# Patient Record
Sex: Male | Born: 1940 | Race: White | Hispanic: No | Marital: Married | Smoking: Former smoker
Health system: Southern US, Community
[De-identification: ages and names within clinical notes are randomized; demographics above are authoritative.]

## PROBLEM LIST (undated history)

## (undated) DIAGNOSIS — E7800 Pure hypercholesterolemia, unspecified: Secondary | ICD-10-CM

## (undated) DIAGNOSIS — I4891 Unspecified atrial fibrillation: Secondary | ICD-10-CM

## (undated) DIAGNOSIS — I10 Essential (primary) hypertension: Secondary | ICD-10-CM

## (undated) DIAGNOSIS — E119 Type 2 diabetes mellitus without complications: Secondary | ICD-10-CM

## (undated) HISTORY — PX: SPLENECTOMY, TOTAL: SHX788

## (undated) HISTORY — PX: APPENDECTOMY: SHX54

## (undated) HISTORY — PX: ABDOMINAL SURGERY: SHX537

---

## 2001-06-24 ENCOUNTER — Encounter
Admission: RE | Admit: 2001-06-24 | Discharge: 2001-06-24 | Payer: Self-pay | Admitting: Physical Medicine and Rehabilitation

## 2001-06-24 ENCOUNTER — Encounter: Payer: Self-pay | Admitting: Physical Medicine and Rehabilitation

## 2007-01-06 ENCOUNTER — Ambulatory Visit: Payer: Self-pay | Admitting: Cardiology

## 2007-11-07 ENCOUNTER — Ambulatory Visit: Payer: Self-pay | Admitting: Cardiology

## 2017-08-11 ENCOUNTER — Emergency Department (HOSPITAL_COMMUNITY): Payer: Self-pay

## 2017-08-11 ENCOUNTER — Emergency Department (HOSPITAL_COMMUNITY)
Admission: EM | Admit: 2017-08-11 | Discharge: 2017-08-11 | Disposition: A | Discharge status: Home / Self Care | Payer: Medicare Other | Attending: Emergency Medicine | Admitting: Emergency Medicine

## 2017-08-11 ENCOUNTER — Encounter (HOSPITAL_COMMUNITY): Payer: Self-pay

## 2017-08-11 ENCOUNTER — Other Ambulatory Visit: Payer: Self-pay

## 2017-08-11 DIAGNOSIS — Z7901 Long term (current) use of anticoagulants: Secondary | ICD-10-CM | POA: Insufficient documentation

## 2017-08-11 DIAGNOSIS — Z7984 Long term (current) use of oral hypoglycemic drugs: Secondary | ICD-10-CM | POA: Insufficient documentation

## 2017-08-11 DIAGNOSIS — I10 Essential (primary) hypertension: Secondary | ICD-10-CM | POA: Insufficient documentation

## 2017-08-11 DIAGNOSIS — Z87891 Personal history of nicotine dependence: Secondary | ICD-10-CM | POA: Insufficient documentation

## 2017-08-11 DIAGNOSIS — E119 Type 2 diabetes mellitus without complications: Secondary | ICD-10-CM | POA: Insufficient documentation

## 2017-08-11 DIAGNOSIS — R0789 Other chest pain: Secondary | ICD-10-CM | POA: Insufficient documentation

## 2017-08-11 DIAGNOSIS — Z79899 Other long term (current) drug therapy: Secondary | ICD-10-CM | POA: Insufficient documentation

## 2017-08-11 HISTORY — DX: Pure hypercholesterolemia, unspecified: E78.00

## 2017-08-11 HISTORY — DX: Unspecified atrial fibrillation: I48.91

## 2017-08-11 HISTORY — DX: Essential (primary) hypertension: I10

## 2017-08-11 HISTORY — DX: Type 2 diabetes mellitus without complications: E11.9

## 2017-08-11 LAB — CBC
HCT: 42.4 % (ref 39.0–52.0)
Hemoglobin: 13.6 g/dL (ref 13.0–17.0)
MCH: 31.3 pg (ref 26.0–34.0)
MCHC: 32.1 g/dL (ref 30.0–36.0)
MCV: 97.7 fL (ref 78.0–100.0)
Platelets: 307 10*3/uL (ref 150–400)
RBC: 4.34 MIL/uL (ref 4.22–5.81)
RDW: 13.5 % (ref 11.5–15.5)
WBC: 9.4 10*3/uL (ref 4.0–10.5)

## 2017-08-11 LAB — TROPONIN I

## 2017-08-11 LAB — BASIC METABOLIC PANEL
Anion gap: 8 (ref 5–15)
BUN: 15 mg/dL (ref 6–20)
CALCIUM: 9.2 mg/dL (ref 8.9–10.3)
CO2: 27 mmol/L (ref 22–32)
Chloride: 101 mmol/L (ref 101–111)
Creatinine, Ser: 1.11 mg/dL (ref 0.61–1.24)
GFR calc Af Amer: >60 mL/min (ref >60)
Glucose, Bld: 213 mg/dL — ABNORMAL HIGH (ref 65–99)
Potassium: 4.3 mmol/L (ref 3.5–5.1)
SODIUM: 136 mmol/L (ref 135–145)

## 2017-08-11 MED ORDER — OXYCODONE-ACETAMINOPHEN 5-325 MG PO TABS: 1.0000 | ORAL_TABLET | ORAL | 0 refills | Status: DC | PRN

## 2017-08-11 NOTE — ED Triage Notes (Signed)
Patient reports of sudden onset of pain in right upper chest that started while sitting on cough. STates he used heating pad and pain went away. States pain is increased when taking deep breath in. Denies shortness of breath or cough.

## 2017-08-11 NOTE — Discharge Instructions (Signed)
Tests showed no life-threatening condition.  I believe your pain is in the wall of your chest, not your heart.  Prescription for pain medicine.  Follow-up your primary care doctor.

## 2017-08-12 NOTE — ED Provider Notes (Signed)
[facility] EMERGENCY DEPARTMENT Provider Note   CSN: [id number] Arrival date & time: 08/11/17  [id number]     History   Chief Complaint Chief Complaint  Patient presents with  . Chest Pain    HPI Lysle RubensRobert L Rowzee is a 77 y.o. male.  Right-sided chest pain since 2 PM on Friday worse with a deep breath.  No crushing substernal chest pain, diaphoresis, dyspnea.  No prolonged travel.  Past medical history includes diabetes, hypertension, hypercholesterolemia, atrial fibrillation.  He quit smoking 10 years ago.  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  Severity of symptoms is mild.      Past Medical History:  Diagnosis Date  . A-fib (HCC)   . Diabetes mellitus without complication (HCC)   . High cholesterol   . Hypertension     There are no active problems to display for this patient.   Past Surgical History:  Procedure Laterality Date  . ABDOMINAL SURGERY    . APPENDECTOMY    . SPLENECTOMY, TOTAL         Home Medications    Prior to Admission medications   Medication Sig Start Date End Date Taking? Authorizing Provider  atenolol (TENORMIN) 25 MG tablet Take 25 mg by mouth daily.   Yes [provider]  Calcium Carb-Cholecalciferol (CALCIUM 500 + D3) 500-600 MG-UNIT TABS Take 1 tablet by mouth daily.   Yes [provider]  cholecalciferol (VITAMIN D) 1000 units tablet Take 1,000 Units by mouth daily.   Yes [provider]  lipase/protease/amylase (CREON) 12000 units CPEP capsule Take 12,000 Units by mouth 2 (two) times daily.   Yes [provider]  magnesium oxide (MAG-OX) 400 MG tablet Take 400 mg by mouth daily.   Yes [provider]  metFORMIN (GLUCOPHAGE) 500 MG tablet Take 500 mg by mouth daily.   Yes [provider]  pantoprazole (PROTONIX) 40 MG tablet Take 40 mg by mouth 2 (two) times daily.   Yes [provider]  simvastatin (ZOCOR) 40 MG tablet Take  20 mg by mouth daily at 6 PM.   Yes [provider]  vitamin B-12 (CYANOCOBALAMIN) 1000 MCG tablet Take 1,000 mcg by mouth daily.   Yes [provider]  warfarin (COUMADIN) 5 MG tablet Take 5 mg by mouth daily at 6 PM.   Yes [provider]  oxyCODONE-acetaminophen (PERCOCET) 5-325 MG tablet Take 1-2 tablets by mouth every 4 (four) hours as needed. 08/11/17   Donnetta Hutchingook, Dayyan Krist, MD    Family History No family history on file.  Social History Social History   Tobacco Use  . Smoking status: Former Smoker    Types: Cigarettes  . Smokeless tobacco: Never Used  Substance Use Topics  . Alcohol use: No    Frequency: Never  . Drug use: No     Allergies   Patient has no known allergies.   Review of Systems Review of Systems  All other systems reviewed and are negative.    Physical Exam Updated Vital Signs BP (!) 133/96   Pulse 99   Temp 98.1 F (36.7 C) (Oral)   Resp 16   Ht 6' (1.829 m)   Wt 63.5 kg (140 lb)   SpO2 95%   BMI 18.99 kg/m   Physical Exam  Constitutional: He is oriented to person, place, and time. He appears well-developed and well-nourished.  HENT:  Head: Normocephalic and atraumatic.  Eyes: Conjunctivae are normal.  Neck:  Neck supple.  Cardiovascular: Normal rate and regular rhythm.  Pulmonary/Chest: Effort normal and breath sounds normal.  Abdominal: Soft. Bowel sounds are normal.  Musculoskeletal: Normal range of motion.  Neurological: He is alert and oriented to person, place, and time.  Skin: Skin is warm and dry.  Psychiatric: He has a normal mood and affect. His behavior is normal.  Nursing note and vitals reviewed.    ED Treatments / Results  Labs (all labs ordered are listed, but only abnormal results are displayed) Labs Reviewed  BASIC METABOLIC PANEL - Abnormal; Notable for the following components:      Result Value   Glucose, Bld 213 (*)    All other components within normal limits  CBC  TROPONIN I     EKG  EKG Interpretation None      Date: 08/12/2017  Rate: 89  Rhythm: atrial fib  QRS Axis: normal  Intervals: normal  ST/T Wave abnormalities: normal  Conduction Disutrbances: none  Narrative Interpretation: unremarkable     Radiology Dg Chest 2 View  Result Date: 08/11/2017 CLINICAL DATA:  Chest pain. EXAM: CHEST  2 VIEW COMPARISON:  Chest radiograph 06/12/2011. FINDINGS: Stable cardiomegaly with tortuosity of the thoracic aorta. No large area of pulmonary consolidation. Biapical pleuroparenchymal thickening. Scarring within the left mid lung. Thoracic spine degenerative changes. IMPRESSION: No acute cardiopulmonary process. Electronically Signed   By: Annia Belt M.D.   On: 08/11/2017 10:48    Procedures Procedures (including critical care time)  Medications Ordered in ED Medications - No data to display   Initial Impression / Assessment and Plan / ED Course  I have reviewed the triage vital signs and the nursing notes.  Pertinent labs & imaging results that were available during my care of the patient were reviewed by me and considered in my medical decision making (see chart for details).     Patient presents with right-sided chest pain worse with deep breath and palpation.  Screening labs, EKG, chest x-ray, troponin all negative.  Discharge medication Percocet.  Patient will follow up with his primary care doctor.  Final Clinical Impressions(s) / ED Diagnoses   Final diagnoses:  Chest wall pain    ED Discharge Orders        Ordered    oxyCODONE-acetaminophen (PERCOCET) 5-325 MG tablet  Every 4 hours PRN     08/11/17 1458       Donnetta Hutching, MD 08/12/17 2230

## 2018-08-04 ENCOUNTER — Emergency Department (HOSPITAL_COMMUNITY): Payer: Non-veteran care

## 2018-08-04 ENCOUNTER — Emergency Department (HOSPITAL_COMMUNITY)
Admission: EM | Admit: 2018-08-04 | Discharge: 2018-08-04 | Disposition: A | Discharge status: Home / Self Care | Payer: Non-veteran care | Attending: Emergency Medicine | Admitting: Emergency Medicine

## 2018-08-04 ENCOUNTER — Encounter (HOSPITAL_COMMUNITY): Payer: Self-pay

## 2018-08-04 DIAGNOSIS — Z7984 Long term (current) use of oral hypoglycemic drugs: Secondary | ICD-10-CM | POA: Insufficient documentation

## 2018-08-04 DIAGNOSIS — K861 Other chronic pancreatitis: Secondary | ICD-10-CM

## 2018-08-04 DIAGNOSIS — R740 Nonspecific elevation of levels of transaminase and lactic acid dehydrogenase [LDH]: Secondary | ICD-10-CM | POA: Diagnosis not present

## 2018-08-04 DIAGNOSIS — Z87891 Personal history of nicotine dependence: Secondary | ICD-10-CM | POA: Diagnosis not present

## 2018-08-04 DIAGNOSIS — Z7901 Long term (current) use of anticoagulants: Secondary | ICD-10-CM | POA: Insufficient documentation

## 2018-08-04 DIAGNOSIS — Z79899 Other long term (current) drug therapy: Secondary | ICD-10-CM | POA: Insufficient documentation

## 2018-08-04 DIAGNOSIS — R1013 Epigastric pain: Secondary | ICD-10-CM | POA: Diagnosis not present

## 2018-08-04 DIAGNOSIS — E119 Type 2 diabetes mellitus without complications: Secondary | ICD-10-CM | POA: Insufficient documentation

## 2018-08-04 DIAGNOSIS — R109 Unspecified abdominal pain: Secondary | ICD-10-CM

## 2018-08-04 DIAGNOSIS — I10 Essential (primary) hypertension: Secondary | ICD-10-CM | POA: Diagnosis not present

## 2018-08-04 DIAGNOSIS — R1110 Vomiting, unspecified: Secondary | ICD-10-CM | POA: Diagnosis present

## 2018-08-04 DIAGNOSIS — R7401 Elevation of levels of liver transaminase levels: Secondary | ICD-10-CM

## 2018-08-04 LAB — CBC WITH DIFFERENTIAL/PLATELET
ABS IMMATURE GRANULOCYTES: 0.04 10*3/uL (ref 0.00–0.07)
BASOS PCT: 0 %
Basophils Absolute: 0 10*3/uL (ref 0.0–0.1)
Eosinophils Absolute: 0 10*3/uL (ref 0.0–0.5)
Eosinophils Relative: 0 %
HEMATOCRIT: 41.3 % (ref 39.0–52.0)
HEMOGLOBIN: 13.3 g/dL (ref 13.0–17.0)
IMMATURE GRANULOCYTES: 1 %
LYMPHS ABS: 2.4 10*3/uL (ref 0.7–4.0)
LYMPHS PCT: 30 %
MCH: 31.3 pg (ref 26.0–34.0)
MCHC: 32.2 g/dL (ref 30.0–36.0)
MCV: 97.2 fL (ref 80.0–100.0)
MONO ABS: 0.7 10*3/uL (ref 0.1–1.0)
MONOS PCT: 9 %
NEUTROS ABS: 4.8 10*3/uL (ref 1.7–7.7)
NEUTROS PCT: 60 %
PLATELETS: 283 10*3/uL (ref 150–400)
RBC: 4.25 MIL/uL (ref 4.22–5.81)
RDW: 14.1 % (ref 11.5–15.5)
WBC: 8 10*3/uL (ref 4.0–10.5)
nRBC: 0 % (ref 0.0–0.2)

## 2018-08-04 LAB — COMPREHENSIVE METABOLIC PANEL
ALK PHOS: 210 U/L — AB (ref 38–126)
ALT: 109 U/L — AB (ref 0–44)
AST: 136 U/L — AB (ref 15–41)
Albumin: 3.5 g/dL (ref 3.5–5.0)
Anion gap: 10 (ref 5–15)
BUN: 13 mg/dL (ref 8–23)
CALCIUM: 9.5 mg/dL (ref 8.9–10.3)
CHLORIDE: 102 mmol/L (ref 98–111)
CO2: 28 mmol/L (ref 22–32)
CREATININE: 1.07 mg/dL (ref 0.61–1.24)
GFR calc non Af Amer: >60 mL/min (ref >60)
Glucose, Bld: 113 mg/dL — ABNORMAL HIGH (ref 70–99)
Potassium: 4.3 mmol/L (ref 3.5–5.1)
Sodium: 140 mmol/L (ref 135–145)
Total Bilirubin: 0.8 mg/dL (ref 0.3–1.2)
Total Protein: 6.5 g/dL (ref 6.5–8.1)

## 2018-08-04 LAB — PROTIME-INR
INR: 1.47
Prothrombin Time: 17.6 seconds — ABNORMAL HIGH (ref 11.4–15.2)

## 2018-08-04 LAB — I-STAT TROPONIN, ED: Troponin i, poc: 0 ng/mL (ref 0.00–0.08)

## 2018-08-04 LAB — CBG MONITORING, ED: Glucose-Capillary: 96 mg/dL (ref 70–99)

## 2018-08-04 LAB — LIPASE, BLOOD: LIPASE: 18 U/L (ref 11–51)

## 2018-08-04 MED ORDER — SUCRALFATE 1 G PO TABS: 1.0000 g | ORAL_TABLET | Freq: Three times a day (TID) | ORAL | 0 refills | Status: DC

## 2018-08-04 MED ORDER — PANTOPRAZOLE SODIUM 40 MG IV SOLR
40.0000 mg | Freq: Once | INTRAVENOUS | Status: AC
  Administered 2018-08-04: 40 mg via INTRAVENOUS
  Filled 2018-08-04: qty 40

## 2018-08-04 MED ORDER — MORPHINE SULFATE (PF) 4 MG/ML IV SOLN
4.0000 mg | Freq: Once | INTRAVENOUS | Status: AC
  Administered 2018-08-04: 4 mg via INTRAVENOUS
  Filled 2018-08-04: qty 1

## 2018-08-04 MED ORDER — IOHEXOL 300 MG/ML  SOLN
100.0000 mL | Freq: Once | INTRAMUSCULAR | Status: AC | PRN
  Administered 2018-08-04: 100 mL via INTRAVENOUS

## 2018-08-04 MED ORDER — LIDOCAINE VISCOUS HCL 2 % MT SOLN: 15.0000 mL | Freq: Once | OROMUCOSAL | Status: DC

## 2018-08-04 MED ORDER — ONDANSETRON HCL 4 MG/2ML IJ SOLN
4.0000 mg | Freq: Once | INTRAMUSCULAR | Status: AC
  Administered 2018-08-04: 4 mg via INTRAVENOUS
  Filled 2018-08-04: qty 2

## 2018-08-04 MED ORDER — ONDANSETRON 4 MG PO TBDP: 4.0000 mg | ORAL_TABLET | Freq: Three times a day (TID) | ORAL | 0 refills | Status: DC | PRN

## 2018-08-04 MED ORDER — SODIUM CHLORIDE 0.9 % IV BOLUS
1000.0000 mL | Freq: Once | INTRAVENOUS | Status: AC
  Administered 2018-08-04: 1000 mL via INTRAVENOUS

## 2018-08-04 MED ORDER — ALUM & MAG HYDROXIDE-SIMETH 200-200-20 MG/5ML PO SUSP: 30.0000 mL | Freq: Once | ORAL | Status: DC

## 2018-08-04 NOTE — ED Triage Notes (Addendum)
Pt states that he is having a burning sensation in abd area to chest; pt states that he has had this since 12/25; pt recently stopped prilosec and was supposed to [facility] proton[facility]. Pt states that he attempted to eat bkfst this am but he vomited it back up.

## 2018-08-04 NOTE — ED Provider Notes (Signed)
[facility] EMERGENCY DEPARTMENT Provider Note   CSN: [id number] Arrival date & time: 08/04/18  1308     History   Chief Complaint Chief Complaint  Patient presents with  . Abdominal Pain    HPI Curtis Morris is a 77 y.o. male with a history of peptic ulcer disease, diabetes mellitus, pancreatitis, A. fib on Coumadin (INR 2.0-3.0), hyperlipidemia, and hypertension who presents to the emergency department with a chief complaint of vomiting.  The patient endorses non-bloody vomiting for the last 5 days. States he vomits every time he tries to eat. He reports associated "burning" abdominal pain that radiates from his abdomen up to his throat. Pain is constant, but worsening since onset.  Reports he has some associated shortness of breath when pain intensifies.  His wife reports that he is also had an approximate 20 pound weight loss over the last few months.  States that he has had episodes of purging after eating due to pain and difficulty swallowing.  He denies fever, chills, melena, hematochezia, hematemesis, palpitations, leg swelling, back pain, dysuria, urinary frequency or hesitancy, diarrhea, constipation, dizziness, or lightheadedness.  He reports a family history of colorectal cancer.  Last colonoscopy was 2 years ago.  He is followed by primary care at the [facility].  He has not established with gastroenterology, but after he was seen 3 days ago was given a referral, but was told that it may take several months for an initial consult.  He reports that he has not drink alcohol or smoke cigarettes in > 20 years.  Reports that he underwent a partial gastrectomy and vagotomy many years ago for peptic ulcer disease.  Sx history includes splenectomy and appendectomy.  The history is provided by the patient. No language interpreter was used.    Past Medical History:  Diagnosis Date  . A-fib ([place])   . Diabetes mellitus without complication ([place])   . High cholesterol    . Hypertension     There are no active problems to display for this patient.   Past Surgical History:  Procedure Laterality Date  . ABDOMINAL SURGERY    . APPENDECTOMY    . SPLENECTOMY, TOTAL          Home Medications    Prior to Admission medications   Medication Sig Start Date End Date Taking? Authorizing Provider  atenolol (TENORMIN) 25 MG tablet Take 25 mg by mouth daily.    [provider]  Calcium Carb-Cholecalciferol (CALCIUM 500 + D3) 500-600 MG-UNIT TABS Take 1 tablet by mouth daily.    [provider]  cholecalciferol (VITAMIN D) 1000 units tablet Take 1,000 Units by mouth daily.    [provider]  lipase/protease/amylase (CREON) 12000 units CPEP capsule Take 12,000 Units by mouth 2 (two) times daily.    [provider]  magnesium oxide (MAG-OX) 400 MG tablet Take 400 mg by mouth daily.    [provider]  metFORMIN (GLUCOPHAGE) 500 MG tablet Take 500 mg by mouth daily.    [provider]  oxyCODONE-acetaminophen (PERCOCET) 5-325 MG tablet Take 1-2 tablets by mouth every 4 (four) hours as needed. 08/11/17   Nat Christen, MD  pantoprazole (PROTONIX) 40 MG tablet Take 40 mg by mouth 2 (two) times daily.    [provider]  simvastatin (ZOCOR) 40 MG tablet Take 20 mg by mouth daily at 6 PM.    [provider]  vitamin B-12 (CYANOCOBALAMIN) 1000 MCG tablet Take 1,000 mcg by mouth daily.  [provider]  warfarin (COUMADIN) 5 MG tablet Take 5 mg by mouth daily at 6 PM.    [provider]    Family History History reviewed. No pertinent family history.  Social History Social History   Tobacco Use  . Smoking status: Former Smoker    Types: Cigarettes  . Smokeless tobacco: Never Used  Substance Use Topics  . Alcohol use: No    Frequency: Never  . Drug use: No     Allergies   Patient has no known allergies.   Review of Systems Review of Systems  Constitutional:  Positive for unexpected weight change. Negative for appetite change, chills and fever.  Respiratory: Negative for shortness of breath.   Cardiovascular: Positive for chest pain. Negative for palpitations and leg swelling.  Gastrointestinal: Positive for abdominal pain and vomiting. Negative for anal bleeding, blood in stool, constipation, diarrhea and rectal pain.  Genitourinary: Negative for dysuria, flank pain, hematuria, penile pain, scrotal swelling, testicular pain and urgency.  Musculoskeletal: Negative for back pain, neck pain and neck stiffness.  Skin: Negative for rash and wound.  Allergic/Immunologic: Negative for immunocompromised state.  Neurological: Negative for weakness, numbness and headaches.  Psychiatric/Behavioral: Negative for confusion.   Physical Exam Updated Vital Signs BP 124/76 (BP Location: Right Arm)   Pulse 98   Temp 97.6 F (36.4 C) (Oral)   Resp 20   Ht 6' (1.829 m)   Wt 56.7 kg   SpO2 96%   BMI 16.95 kg/m   Physical Exam Vitals signs and nursing note reviewed.  Constitutional:      Appearance: He is well-developed.     Comments: Thin appearing elderly male.   HENT:     Head: Normocephalic.  Eyes:     Conjunctiva/sclera: Conjunctivae normal.  Neck:     Musculoskeletal: Neck supple.  Cardiovascular:     Rate and Rhythm: Normal rate and regular rhythm.     Heart sounds: No murmur. No friction rub. No gallop.   Pulmonary:     Effort: Pulmonary effort is normal. No respiratory distress.     Breath sounds: No stridor. No wheezing, rhonchi or rales.  Abdominal:     General: There is no distension.     Palpations: Abdomen is soft.     Tenderness: There is abdominal tenderness. There is no rebound. Negative signs include Murphy's sign, Rovsing's sign, McBurney's sign and psoas sign.     Comments: Tender to palpation in the epigastric and right upper quadrant.  Questionable Murphy sign.  No abdominal distention.  Lower abdomen is unremarkable.  No CVA  tenderness bilaterally.  No rebound or guarding.  Skin:    General: Skin is warm and dry.     Capillary Refill: Capillary refill takes less than 2 seconds.  Neurological:     Mental Status: He is alert.  Psychiatric:        Behavior: Behavior normal.    ED Treatments / Results  Labs (all labs ordered are listed, but only abnormal results are displayed) Labs Reviewed  PROTIME-INR - Abnormal; Notable for the following components:      Result Value   Prothrombin Time 17.6 (*)    All other components within normal limits  COMPREHENSIVE METABOLIC PANEL - Abnormal; Notable for the following components:   Glucose, Bld 113 (*)    AST 136 (*)    ALT 109 (*)    Alkaline Phosphatase 210 (*)    All other components within normal limits  CBC WITH  DIFFERENTIAL/PLATELET  LIPASE, BLOOD  CBG MONITORING, ED  I-STAT TROPONIN, ED  POC OCCULT BLOOD, ED    EKG EKG Interpretation  Date/Time:  _54  year old male with a history of peptic ulcer disease, diabetes mellitus, pancreatitis, A. fib on Coumadin (INR 2.0-3.0), hyperlipidemia, and hypertension who presents to the emergency department with abdominal pain, vomiting, and unintended weight loss.  Reports a 20 pound unintended weight loss over the last few months.  States he has been unable to keep down any  food for the last 5 days.  He was seen by his primary care provider at the [place] 4 days ago where he had an ultrasound and a CT performed, but the studies are unavailable at this time.  He was referred to GI for follow-up, but was told he would not be able to get an appointment for several months.  On exam, the patient is tender to palpation in the epigastric and right upper quadrant.  Questionable Murphy sign.  Morphine and Protonix given for pain control.  Zofran for nausea.  He is hemodynamically stable at this time.  INR is subtherapeutic at 1.49.  He has no leukocytosis.  He has elevated transaminases and alk phos.  No previous labs available for comparison.  Right upper quadrant ultrasound with layering sludge within the gallbladder, but otherwise negative.  Abdominal x-ray with air-fluid levels on the erect image and mildly distended small bowel loops in the upper abdomen concerning for partial small bowel obstruction versus ileus.  CT abdomen pelvis is pending.  Patient care transferred to Dr. Rex Kras at the end of my shift.  I anticipate the patient will require admission for endoscopy given his recent weight loss  and concern for partial small bowel obstruction on x-ray. Patient presentation, ED course, and plan of care discussed with review of all pertinent labs and imaging. Please see his/her note for further details regarding further ED course and disposition.   Final Clinical Impressions(s) / ED Diagnoses   Final diagnoses:  Abdominal pain  Elevated transaminase level    ED Discharge Orders    None       Joanne Gavel, PA-C 08/04/18 1746    Maudie Flakes, MD 08/05/18 [phone]

## 2018-08-04 NOTE — ED Notes (Signed)
Pt to CT at this time.

## 2018-08-04 NOTE — ED Provider Notes (Signed)
I received this patient [facility]. Pt p/w epigastric pain and nausea/vomiting as well as weight loss over the past few months.  Lab work was notable for mildly elevated LFTs.  The patient denies any alcohol use or heavy Tylenol use.  He denies any history of hepatitis.  Right upper quadrant ultrasound shows gallbladder sludge but no evidence of cholelithiasis or cholecystitis.  Proceeded with CT of abdomen and pelvis given abnormal KUB showing possible partial small bowel obstruction versus ileus.  CT shows no acute findings to explain the patient's symptoms.  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  I noted that his medication list includes PPI and Creon.  When asking further information about his symptoms, it sounds like his problems with eating and abdominal pain have been going on for many years, only worse recently.  He has had no vomiting in the ED.  His lab work shows no evidence of dehydration and no hypoalbuminemia to suggest severe malnutrition.  I contacted GI, Dr. Loreta AveMann, to discuss close outpatient f/u in GI clinic for consideration of upper endoscopy. Given reassuring imaging and VS, I feel he is safe for discharge. Will add carafate to his medications and reviewed GERD diet. Also provided w/ zofran. Return precautions reviewed.   Curtis Morris, Ambrose Finlandachel Morgan, MD 08/04/18 [phone]

## 2018-08-06 ENCOUNTER — Telehealth: Payer: Self-pay | Admitting: *Deleted

## 2018-08-06 NOTE — Telephone Encounter (Signed)
Pharmacy called related to Rx: zofran ODT not available .Marland Kitchen.Marland Kitchen.EDCM clarified with EDP (Hedges) to change Rx to: zofran tablets.

## 2021-11-24 ENCOUNTER — Other Ambulatory Visit: Payer: Self-pay

## 2021-11-24 ENCOUNTER — Encounter (HOSPITAL_COMMUNITY): Payer: Self-pay | Admitting: *Deleted

## 2021-11-24 ENCOUNTER — Emergency Department (HOSPITAL_COMMUNITY): Payer: No Typology Code available for payment source

## 2021-11-24 ENCOUNTER — Inpatient Hospital Stay (HOSPITAL_COMMUNITY)
Admission: EM | Admit: 2021-11-24 | Discharge: 2021-11-27 | DRG: 605 | Disposition: A | Discharge status: Home Health | Payer: No Typology Code available for payment source | Attending: Internal Medicine | Admitting: Internal Medicine

## 2021-11-24 DIAGNOSIS — Z7901 Long term (current) use of anticoagulants: Secondary | ICD-10-CM

## 2021-11-24 DIAGNOSIS — E119 Type 2 diabetes mellitus without complications: Secondary | ICD-10-CM

## 2021-11-24 DIAGNOSIS — I4891 Unspecified atrial fibrillation: Secondary | ICD-10-CM | POA: Diagnosis not present

## 2021-11-24 DIAGNOSIS — W19XXXA Unspecified fall, initial encounter: Principal | ICD-10-CM | POA: Diagnosis present

## 2021-11-24 DIAGNOSIS — Z87891 Personal history of nicotine dependence: Secondary | ICD-10-CM

## 2021-11-24 DIAGNOSIS — R2681 Unsteadiness on feet: Secondary | ICD-10-CM | POA: Diagnosis not present

## 2021-11-24 DIAGNOSIS — Y92009 Unspecified place in unspecified non-institutional (private) residence as the place of occurrence of the external cause: Secondary | ICD-10-CM

## 2021-11-24 DIAGNOSIS — E7800 Pure hypercholesterolemia, unspecified: Secondary | ICD-10-CM | POA: Diagnosis present

## 2021-11-24 DIAGNOSIS — I10 Essential (primary) hypertension: Secondary | ICD-10-CM | POA: Diagnosis not present

## 2021-11-24 DIAGNOSIS — S40022A Contusion of left upper arm, initial encounter: Principal | ICD-10-CM | POA: Diagnosis present

## 2021-11-24 DIAGNOSIS — Z79899 Other long term (current) drug therapy: Secondary | ICD-10-CM

## 2021-11-24 DIAGNOSIS — Z7984 Long term (current) use of oral hypoglycemic drugs: Secondary | ICD-10-CM

## 2021-11-24 DIAGNOSIS — E11649 Type 2 diabetes mellitus with hypoglycemia without coma: Secondary | ICD-10-CM | POA: Diagnosis present

## 2021-11-24 DIAGNOSIS — H5461 Unqualified visual loss, right eye, normal vision left eye: Secondary | ICD-10-CM | POA: Diagnosis present

## 2021-11-24 DIAGNOSIS — W11XXXA Fall on and from ladder, initial encounter: Secondary | ICD-10-CM | POA: Diagnosis present

## 2021-11-24 DIAGNOSIS — M7989 Other specified soft tissue disorders: Secondary | ICD-10-CM | POA: Diagnosis not present

## 2021-11-24 DIAGNOSIS — Z7982 Long term (current) use of aspirin: Secondary | ICD-10-CM

## 2021-11-24 DIAGNOSIS — Z8249 Family history of ischemic heart disease and other diseases of the circulatory system: Secondary | ICD-10-CM

## 2021-11-24 DIAGNOSIS — I951 Orthostatic hypotension: Secondary | ICD-10-CM | POA: Diagnosis not present

## 2021-11-24 LAB — BASIC METABOLIC PANEL
Anion gap: 5 (ref 5–15)
BUN: 13 mg/dL (ref 8–23)
CO2: 28 mmol/L (ref 22–32)
Calcium: 9 mg/dL (ref 8.9–10.3)
Chloride: 104 mmol/L (ref 98–111)
Creatinine, Ser: 0.93 mg/dL (ref 0.61–1.24)
GFR, Estimated: >60 mL/min (ref >60)
Glucose, Bld: 115 mg/dL — ABNORMAL HIGH (ref 70–99)
Potassium: 3.8 mmol/L (ref 3.5–5.1)
Sodium: 137 mmol/L (ref 135–145)

## 2021-11-24 LAB — CBC WITH DIFFERENTIAL/PLATELET
Abs Immature Granulocytes: 0.02 10*3/uL (ref 0.00–0.07)
Basophils Absolute: 0.1 10*3/uL (ref 0.0–0.1)
Basophils Relative: 1 %
Eosinophils Absolute: 0.2 10*3/uL (ref 0.0–0.5)
Eosinophils Relative: 2 %
HCT: 40.3 % (ref 39.0–52.0)
Hemoglobin: 13.3 g/dL (ref 13.0–17.0)
Immature Granulocytes: 0 %
Lymphocytes Relative: 34 %
Lymphs Abs: 2.7 10*3/uL (ref 0.7–4.0)
MCH: 32 pg (ref 26.0–34.0)
MCHC: 33 g/dL (ref 30.0–36.0)
MCV: 97.1 fL (ref 80.0–100.0)
Monocytes Absolute: 0.8 10*3/uL (ref 0.1–1.0)
Monocytes Relative: 10 %
Neutro Abs: 4.2 10*3/uL (ref 1.7–7.7)
Neutrophils Relative %: 53 %
Platelets: 302 10*3/uL (ref 150–400)
RBC: 4.15 MIL/uL — ABNORMAL LOW (ref 4.22–5.81)
RDW: 13.9 % (ref 11.5–15.5)
WBC: 7.9 10*3/uL (ref 4.0–10.5)
nRBC: 0 % (ref 0.0–0.2)

## 2021-11-24 LAB — HEMOGLOBIN AND HEMATOCRIT, BLOOD
HCT: 34.1 % — ABNORMAL LOW (ref 39.0–52.0)
Hemoglobin: 11.1 g/dL — ABNORMAL LOW (ref 13.0–17.0)

## 2021-11-24 MED ORDER — HYDROMORPHONE HCL 1 MG/ML IJ SOLN
0.5000 mg | INTRAMUSCULAR | Status: DC | PRN
  Administered 2021-11-24 – 2021-11-25 (×2): 0.5 mg via INTRAVENOUS
  Filled 2021-11-24 (×2): qty 0.5

## 2021-11-24 MED ORDER — ONDANSETRON HCL 4 MG/2ML IJ SOLN
4.0000 mg | Freq: Four times a day (QID) | INTRAMUSCULAR | Status: DC | PRN
Start: 2021-11-24 — End: 2021-11-27
  Administered 2021-11-27: 4 mg via INTRAVENOUS
  Filled 2021-11-24: qty 2

## 2021-11-24 MED ORDER — ACETAMINOPHEN 650 MG RE SUPP
650.0000 mg | Freq: Four times a day (QID) | RECTAL | Status: DC | PRN
Start: 2021-11-24 — End: 2021-11-27

## 2021-11-24 MED ORDER — POLYETHYLENE GLYCOL 3350 17 G PO PACK: 17.0000 g | PACK | Freq: Every day | ORAL | Status: DC | PRN

## 2021-11-24 MED ORDER — TAMSULOSIN HCL 0.4 MG PO CAPS
0.4000 mg | ORAL_CAPSULE | Freq: Every day | ORAL | Status: DC
  Administered 2021-11-24 – 2021-11-26 (×3): 0.4 mg via ORAL
  Filled 2021-11-24 (×3): qty 1

## 2021-11-24 MED ORDER — ACETAMINOPHEN 325 MG PO TABS: 650.0000 mg | ORAL_TABLET | Freq: Four times a day (QID) | ORAL | Status: DC | PRN

## 2021-11-24 MED ORDER — HYDROCODONE-ACETAMINOPHEN 5-325 MG PO TABS
1.0000 | ORAL_TABLET | Freq: Once | ORAL | Status: AC
  Administered 2021-11-24: 1 via ORAL
  Filled 2021-11-24: qty 1

## 2021-11-24 MED ORDER — FENTANYL CITRATE PF 50 MCG/ML IJ SOSY
25.0000 ug | PREFILLED_SYRINGE | Freq: Once | INTRAMUSCULAR | Status: AC
  Administered 2021-11-24: 25 ug via INTRAVENOUS
  Filled 2021-11-24: qty 1

## 2021-11-24 MED ORDER — ONDANSETRON HCL 4 MG PO TABS
4.0000 mg | ORAL_TABLET | Freq: Four times a day (QID) | ORAL | Status: DC | PRN
  Administered 2021-11-24: 4 mg via ORAL
  Filled 2021-11-24: qty 1

## 2021-11-24 MED ORDER — PANCRELIPASE (LIP-PROT-AMYL) 12000-38000 UNITS PO CPEP
12000.0000 [IU] | ORAL_CAPSULE | Freq: Two times a day (BID) | ORAL | Status: DC
  Administered 2021-11-25 – 2021-11-27 (×6): 12000 [IU] via ORAL
  Filled 2021-11-24 (×6): qty 1

## 2021-11-24 MED ORDER — PANTOPRAZOLE SODIUM 40 MG PO TBEC
40.0000 mg | DELAYED_RELEASE_TABLET | Freq: Two times a day (BID) | ORAL | Status: DC
  Administered 2021-11-24 – 2021-11-27 (×6): 40 mg via ORAL
  Filled 2021-11-24 (×6): qty 1

## 2021-11-24 MED ORDER — METOPROLOL TARTRATE 50 MG PO TABS
50.0000 mg | ORAL_TABLET | Freq: Two times a day (BID) | ORAL | Status: DC
  Filled 2021-11-24: qty 1

## 2021-11-24 NOTE — ED Notes (Signed)
Patient transported to CT 

## 2021-11-24 NOTE — ED Notes (Signed)
Ice pack applied to left arm.  °

## 2021-11-24 NOTE — ED Notes (Signed)
EDP made aware of pts worsening arm swelling and pain ?

## 2021-11-24 NOTE — Assessment & Plan Note (Signed)
Patient fell off a ladder while he was painting, he was also wearing his bedroom shoes.  He fell backwards hitting the left side of his head, his left arm and left wrist area.  ?-Head and cervical CT without acute abnormality. ?-Reports about 2-3 falls since January 2/2 initial orthostatic symptoms that occur when he is getting up from sitting or lying position.  ?-PT evaluation ?

## 2021-11-24 NOTE — H&P (Signed)
?History and Physical  ? ? ?Curtis Morris [id number] DOB: [birth year] DOA: 11/24/2021 ? ?PCP: [facility] [place]  ? ?Patient coming from: Home ? ?I have personally briefly reviewed patient's old medical records in [place] ? ?Chief Complaint: Fall ? ?HPI: Curtis Morris is a 81 y.o. male with medical history significant for atrial fibrillation on chronic anticoagulation with Xarelto, diabetes mellitus, hypertension. ?Presented to the ED with complaints of a fall.  Patient was standing on a ladder painting when he fell off down 3 steps.  He fell backwards hitting the left side of his head, his left arm, and left wrist area.  He sustained bruising to his arm. ?He reports 2-3 falls since January due to initial dizziness that occurs when he suddenly gets up from a sitting or lying position.  Dizziness usually resolves quickly, does not occur if he takes his time getting up.  He is blind in his right eye. ?He is on Eliquis and his last dose was this morning. ? ?ED Course: Stable vitals.  Head and cervical CT negative for acute abnormality.  X-ray of left humerus shows soft tissue swelling no acute fracture. ?Unremarkable CBC BMP. ?Hematoma involving left upper extremity significantly progressed while in the ED. ED provider talked to orthopedist on-call, he will see patient in the ED.  Hospitalist to admit due to concern for worsening hematoma and possible development of compartment syndrome. ? ?Review of Systems: As per HPI all other systems reviewed and negative. ? ?Past Medical History:  ?Diagnosis Date  ? A-fib ([place])   ? Diabetes mellitus without complication ([place])   ? High cholesterol   ? Hypertension   ? ? ?Past Surgical History:  ?Procedure Laterality Date  ? ABDOMINAL SURGERY    ? APPENDECTOMY    ? SPLENECTOMY, TOTAL    ? ? ? reports that he has quit smoking. His smoking use included cigarettes. He has never used smokeless tobacco. He reports that he does not drink alcohol and does not use  drugs. ? ?No Known Allergies ? ?Family history of hypertension. ? ?Prior to Admission medications   ?Medication Sig Start Date End Date Taking? Authorizing Provider  ?aspirin EC 81 MG tablet Take 81 mg by mouth daily. Swallow whole.   Yes [provider]  ?ATORVASTATIN CALCIUM PO Take 20 mg by mouth at bedtime.   Yes [provider]  ?Calcium Carbonate-Vitamin D (CALCIUM-VITAMIN D3 PO) Take 1 tablet by mouth 2 (two) times daily.   Yes [provider]  ?lipase/protease/amylase (CREON) 12000 units CPEP capsule Take 12,000 Units by mouth 2 (two) times daily.   Yes [provider]  ?magnesium oxide (MAG-OX) 400 MG tablet Take 400 mg by mouth daily.   Yes [provider]  ?metFORMIN (GLUCOPHAGE) 500 MG tablet Take 500 mg by mouth every evening.   Yes [provider]  ?metoprolol tartrate (LOPRESSOR) 100 MG tablet Take 50 mg by mouth 2 (two) times daily.   Yes [provider]  ?pantoprazole (PROTONIX) 40 MG tablet Take 40 mg by mouth 2 (two) times daily.   Yes [provider]  ?tamsulosin (FLOMAX) 0.4 MG CAPS capsule Take 0.4 mg by mouth at bedtime.   Yes [provider]  ?apixaban (ELIQUIS) 5 MG TABS tablet Take 5 mg by mouth 2 (two) times daily.    [provider]  ?cyclobenzaprine (FLEXERIL) 10 MG tablet Take 10 mg by mouth at bedtime.    [provider]  ?ondansetron (ZOFRAN ODT)  4 MG disintegrating tablet Take 1 tablet (4 mg total) by mouth every 8 (eight) hours as needed for nausea or vomiting. ?Patient not taking: Reported on 11/24/2021 08/04/18   Little, Wenda Overland, MD  ?oxyCODONE-acetaminophen (PERCOCET) 5-325 MG tablet Take 1-2 tablets by mouth every 4 (four) hours as needed. ?Patient not taking: Reported on 11/24/2021 08/11/17   Nat Christen, MD  ?sucralfate (CARAFATE) 1 g tablet Take 1 tablet (1 g total) by mouth 4 (four) times daily -  with meals and at bedtime for 7 days. ?Patient not taking: Reported on  11/24/2021 08/04/18 08/11/18  Little, Wenda Overland, MD  ? ? ?Physical Exam: ?Vitals:  ? 11/24/21 1900 11/24/21 1930 11/24/21 2000 11/24/21 2019  ?BP: (!) 161/80 (!) 157/95 (!) 114/98   ?Pulse: 87 82 90   ?Resp: 18 20 (!) 22   ?Temp:    97.8 ?F (36.6 ?C)  ?TempSrc:    Oral  ?SpO2: 99% 97% 97%   ?Weight:      ?Height:      ? ? ?Constitutional: NAD, calm, comfortable ?Vitals:  ? 11/24/21 1900 11/24/21 1930 11/24/21 2000 11/24/21 2019  ?BP: (!) 161/80 (!) 157/95 (!) 114/98   ?Pulse: 87 82 90   ?Resp: 18 20 (!) 22   ?Temp:    97.8 ?F (36.6 ?C)  ?TempSrc:    Oral  ?SpO2: 99% 97% 97%   ?Weight:      ?Height:      ? ?Eyes: pupils equal, lids and conjunctivae normal ?ENMT: Mucous membranes are moist.  ?Neck: normal, supple, no masses, no thyromegaly ?Respiratory: clear to auscultation bilaterally, no wheezing, no crackles. Normal respiratory effort. No accessory muscle use.  ?Cardiovascular: Regular rate and rhythm, no murmurs / rubs / gallops. No extremity edema.  Lower extremities warm.   ?Abdomen: no tenderness, no masses palpated. No hepatosplenomegaly. Bowel sounds positive.  ?Musculoskeletal: no clubbing / cyanosis. No joint deformity upper and lower extremities.  ?Skin: Large hematoma to left mid arm, extending downwards toward mid forearm, very superficial bruising to left arm, and left wrist.  Initially patient had a compressive dressing to his left forearm, on my evaluation, the left hand was was cold compared to his right, and had a dusky appearance.  He was able to squeeze my fingers, radial pulse palpable and present, reduced ulnar pulse. ?Talked to ED provider and patient was reevaluated, we had the compressive dressing removed.  On subsequent evaluation after ~108mins, left arm and hand are warm, very slight coolness to the tips of his fingers, significantly improved color. ?Neurologic: No apparent abnormality, moving extremities spontaneously.  ?Psychiatric: Normal judgment and insight. Alert and oriented x 3.  Normal mood.  ? ?Labs on Admission: I have personally reviewed following labs and imaging studies ? ?CBC: ?Recent Labs  ?Lab 11/24/21 ?1746  ?WBC 7.9  ?NEUTROABS 4.2  ?HGB 13.3  ?HCT 40.3  ?MCV 97.1  ?PLT 302  ? ?Basic Metabolic Panel: ?Recent Labs  ?Lab 11/24/21 ?1746  ?NA 137  ?K 3.8  ?CL 104  ?CO2 28  ?GLUCOSE 115*  ?BUN 13  ?CREATININE 0.93  ?CALCIUM 9.0  ? ? ?Radiological Exams on Admission: ?CT Head Wo Contrast ? ?Result Date: 11/24/2021 ?CLINICAL DATA:  Head injury after fall. EXAM: CT HEAD WITHOUT CONTRAST CT CERVICAL SPINE WITHOUT CONTRAST TECHNIQUE: Multidetector CT imaging of the head and cervical spine was performed following the standard protocol without intravenous contrast. Multiplanar CT image reconstructions of the cervical spine were also generated. RADIATION DOSE REDUCTION: This  exam was performed according to the departmental dose-optimization program which includes automated exposure control, adjustment of the mA and/or kV according to patient size and/or use of iterative reconstruction technique. COMPARISON:  None available currently. FINDINGS: CT HEAD FINDINGS Brain: Mild chronic ischemic white matter disease is noted. No mass effect or midline shift is noted. Ventricular size is within normal limits. There is no evidence of mass lesion, hemorrhage or acute infarction. Vascular: No hyperdense vessel or unexpected calcification. Skull: Normal. Negative for fracture or focal lesion. Sinuses/Orbits: Left frontal and ethmoid sinusitis is noted. Other: None. CT CERVICAL SPINE FINDINGS Alignment: Normal. Skull base and vertebrae: No acute fracture. No primary bone lesion or focal pathologic process. Soft tissues and spinal canal: No prevertebral fluid or swelling. No visible canal hematoma. Disc levels: Mild degenerative disc disease is noted at C4-5. Severe degenerative disc disease is noted at C6-7. Upper chest: Negative. Other: None. IMPRESSION: No acute intracranial abnormality seen. 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 No acute abnormality is noted. Electronically Signed   By: Marijo Conception M.D.   On: 11/24/2021 16:46  ? ?CT Cervical Spine Wo Contrast ? ?Result Date: 4/20

## 2021-11-24 NOTE — Assessment & Plan Note (Signed)
Stable ?-Resume metoprolol and tamsulosin in a.m. ?

## 2021-11-24 NOTE — ED Triage Notes (Signed)
Pt was painting and tripped and fell; pt has laceration to left upper arm, left wrist and hematoma to left head ?

## 2021-11-24 NOTE — ED Notes (Signed)
Pt has swelling to left upper arm, large skin tear and severe pain. Pt on blood thinner and reports he hit his head when he fell.  ?

## 2021-11-24 NOTE — Assessment & Plan Note (Signed)
-   HgbA1c ?- SSI- S ?-Hold home metformin ?

## 2021-11-24 NOTE — ED Provider Notes (Signed)
?[facility] EMERGENCY DEPARTMENT ?Provider Note ? ? ?CSN: [id number] ?Arrival date & time: 11/24/21  1440 ? ?  ? ?History ?Chief Complaint  ?Patient presents with  ? Fall  ? ? ?Curtis Morris is a 81 y.o. male with history of atrial fibrillation on Xarelto who presents to the emergency department today after a fall.  Patient states that he was painting and lost his balance and fell down 3 steps.  He struck his left arm and hit his head.  He did not lose consciousness.  He is complaining of swelling to the left upper extremity. ? ? ?Fall ? ? ?  ? ?Home Medications ?Prior to Admission medications   ?Medication Sig Start Date End Date Taking? Authorizing Provider  ?aspirin EC 81 MG tablet Take 81 mg by mouth daily. Swallow whole.   Yes [provider]  ?ATORVASTATIN CALCIUM PO Take 20 mg by mouth at bedtime.   Yes [provider]  ?lipase/protease/amylase (CREON) 12000 units CPEP capsule Take 12,000 Units by mouth 2 (two) times daily.   Yes [provider]  ?magnesium oxide (MAG-OX) 400 MG tablet Take 400 mg by mouth daily.   Yes [provider]  ?metFORMIN (GLUCOPHAGE) 500 MG tablet Take 500 mg by mouth every evening.   Yes [provider]  ?metoprolol tartrate (LOPRESSOR) 100 MG tablet Take 50 mg by mouth 2 (two) times daily.   Yes [provider]  ?pantoprazole (PROTONIX) 40 MG tablet Take 40 mg by mouth 2 (two) times daily.   Yes [provider]  ?tamsulosin (FLOMAX) 0.4 MG CAPS capsule Take 0.4 mg by mouth at bedtime.   Yes [provider]  ?apixaban (ELIQUIS) 5 MG TABS tablet Take 5 mg by mouth 2 (two) times daily.    [provider]  ?atenolol (TENORMIN) 25 MG tablet Take 25 mg by mouth daily.    [provider]  ?Calcium Carb-Cholecalciferol (CALCIUM 500 + D3) 500-600 MG-UNIT TABS Take 1 tablet by mouth daily.    [provider]  ?cholecalciferol (VITAMIN D) 1000 units tablet Take 1,000 Units by mouth daily.     [provider]  ?cyclobenzaprine (FLEXERIL) 10 MG tablet Take 10 mg by mouth at bedtime.    [provider]  ?ondansetron (ZOFRAN ODT) 4 MG disintegrating tablet Take 1 tablet (4 mg total) by mouth every 8 (eight) hours as needed for nausea or vomiting. 08/04/18   Little, Ambrose Finlandachel Morgan, MD  ?oxyCODONE-acetaminophen (PERCOCET) 5-325 MG tablet Take 1-2 tablets by mouth every 4 (four) hours as needed. 08/11/17   Donnetta Hutchingook, Brian, MD  ?simvastatin (ZOCOR) 40 MG tablet Take 20 mg by mouth daily at 6 PM.    [provider]  ?sucralfate (CARAFATE) 1 g tablet Take 1 tablet (1 g total) by mouth 4 (four) times daily -  with meals and at bedtime for 7 days. 08/04/18 08/11/18  Little, Ambrose Finlandachel Morgan, MD  ?vitamin B-12 (CYANOCOBALAMIN) 1000 MCG tablet Take 1,000 mcg by mouth daily.    [provider]  ?warfarin (COUMADIN) 5 MG tablet Take 5 mg by mouth daily at 6 PM.    [provider]  ?   ? ?Allergies    ?Patient has no known allergies.   ? ?Review of Systems   ?Review of Systems  ?All other systems reviewed and are negative. ? ?Physical Exam ?Updated Vital Signs ?BP (!) 114/98 (BP Location: Right Arm)   Pulse 90   Temp 97.9 ?F (36.6 ?C) (Oral)  Resp (!) 22   Ht 5\' 11"  (1.803 m)   Wt 63.5 kg   SpO2 97%   BMI 19.53 kg/m?  ?Physical Exam ?Vitals and nursing note reviewed.  ?Constitutional:   ?   General: He is not in acute distress. ?   Appearance: Normal appearance.  ?HENT:  ?   Head: Normocephalic and atraumatic.  ?Eyes:  ?   General:     ?   Right eye: No discharge.     ?   Left eye: No discharge.  ?Cardiovascular:  ?   Comments: Regular rate and rhythm.  S1/S2 are distinct without any evidence of murmur, rubs, or gallops.  Radial pulses are 2+ bilaterally.  Dorsalis pedis pulses are 2+ bilaterally.  No evidence of pedal edema. ?Pulmonary:  ?   Comments: Clear to auscultation bilaterally.  Normal effort.  No respiratory distress.  No evidence of wheezes, rales, or rhonchi heard  throughout. ?Abdominal:  ?   General: Abdomen is flat. Bowel sounds are normal. There is no distension.  ?   Tenderness: There is no abdominal tenderness. There is no guarding or rebound.  ?Musculoskeletal:     ?   General: Normal range of motion.  ?   Cervical back: Neck supple.  ?Skin: ?   General: Skin is warm and dry.  ?   Findings: No rash.  ?   Comments: There is skin tear to the left with moderate size underlying hematoma.  Hematoma is soft.  No evidence of compartment syndrome at this time. There is evidence of skin tear to the left anterior aspect of the wrist.  ?Neurological:  ?   General: No focal deficit present.  ?   Mental Status: He is alert.  ?Psychiatric:     ?   Mood and Affect: Mood normal.     ?   Behavior: Behavior normal.  ? ? ?ED Results / Procedures / Treatments   ?Labs ?(all labs ordered are listed, but only abnormal results are displayed) ?Labs Reviewed  ?CBC WITH DIFFERENTIAL/PLATELET - Abnormal; Notable for the following components:  ?    Result Value  ? RBC 4.15 (*)   ? All other components within normal limits  ?BASIC METABOLIC PANEL - Abnormal; Notable for the following components:  ? Glucose, Bld 115 (*)   ? All other components within normal limits  ? ? ?EKG ?None ? ?Radiology ?CT Head Wo Contrast ? ?Result Date: 11/24/2021 ?CLINICAL DATA:  Head injury after fall. EXAM: CT HEAD WITHOUT CONTRAST CT CERVICAL SPINE WITHOUT CONTRAST TECHNIQUE: Multidetector CT imaging of the head and cervical spine was performed following the standard protocol without intravenous contrast. Multiplanar CT image reconstructions of the cervical spine were also generated. RADIATION DOSE REDUCTION: This exam was performed according to the departmental dose-optimization program which includes automated exposure control, adjustment of the mA and/or kV according to patient size and/or use of iterative reconstruction technique. COMPARISON:  None available currently. FINDINGS: CT HEAD FINDINGS Brain: Mild chronic  ischemic white matter disease is noted. No mass effect or midline shift is noted. Ventricular size is within normal limits. There is no evidence of mass lesion, hemorrhage or acute infarction. Vascular: No hyperdense vessel or unexpected calcification. Skull: Normal. Negative for fracture or focal lesion. Sinuses/Orbits: Left frontal and ethmoid sinusitis is noted. Other: None. CT CERVICAL SPINE FINDINGS Alignment: Normal. Skull base and vertebrae: No acute fracture. No primary bone lesion or focal pathologic process. Soft tissues and spinal canal: No prevertebral fluid or  swelling. No visible canal hematoma. Disc levels: Mild degenerative disc disease is noted at C4-5. Severe degenerative disc disease is noted at C6-7. Upper chest: Negative. Other: None. IMPRESSION: No acute intracranial abnormality seen. 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 No acute abnormality is noted. Electronically Signed   By: Marijo Conception M.D.   On: 11/24/2021 16:46  ? ?CT Cervical Spine Wo Contrast ? ?Result Date: 11/24/2021 ?CLINICAL DATA:  Head injury after fall. EXAM: CT HEAD WITHOUT CONTRAST CT CERVICAL SPINE WITHOUT CONTRAST TECHNIQUE: Multidetector CT imaging of the head and cervical spine was performed following the standard protocol without intravenous contrast. Multiplanar CT image reconstructions of the cervical spine were also generated. RADIATION DOSE REDUCTION: This exam was performed according to the departmental dose-optimization program which includes automated exposure control, adjustment of the mA and/or kV according to patient size and/or use of iterative reconstruction technique. COMPARISON:  None available currently. FINDINGS: CT HEAD FINDINGS Brain: Mild chronic ischemic white matter disease is noted. No mass effect or midline shift is noted. Ventricular size is within normal limits. There is no evidence of mass lesion, hemorrhage or acute infarction. Vascular: No hyperdense vessel or  unexpected calcification. Skull: Normal. Negative for fracture or focal lesion. Sinuses/Orbits: Left frontal and ethmoid sinusitis is noted. Other: None. CT CERVICAL SPINE FINDINGS Alignment: Normal. Skull base and

## 2021-11-24 NOTE — Assessment & Plan Note (Addendum)
Rate controlled on anticoagulation with Xarelto ?-Hold Xarelto for now ?- Resume Metoprolol in a.m. ?

## 2021-11-24 NOTE — Assessment & Plan Note (Addendum)
Significant hematoma to left upper extremity extending to mid forearm. Initial reduced blood flow to left hand due to compressive dressing which has been removed with improvement in blood flow.  At this time no concern for compartment syndrome. ?- EDP talked to Dr. Dallas Schimke will see in the morning ?-Regular dressing to left upper extremity, hold off on compressive dressing for now ?-Check H&H, repeat CBC in the morning ?-IV Dilaudid 0.5 every 4 hours as needed ?-Hold aspirin and Plavix ?

## 2021-11-24 NOTE — ED Notes (Signed)
Provider at bedside

## 2021-11-24 NOTE — ED Notes (Signed)
EDP made aware of worsening swelling and bruising to left arm. Swelling now to forearm. Orders to apply ace bandage for pressure and continue with ice.  ?

## 2021-11-25 DIAGNOSIS — Z7984 Long term (current) use of oral hypoglycemic drugs: Secondary | ICD-10-CM | POA: Diagnosis not present

## 2021-11-25 DIAGNOSIS — Z79899 Other long term (current) drug therapy: Secondary | ICD-10-CM | POA: Diagnosis not present

## 2021-11-25 DIAGNOSIS — Z87891 Personal history of nicotine dependence: Secondary | ICD-10-CM | POA: Diagnosis not present

## 2021-11-25 DIAGNOSIS — Z7901 Long term (current) use of anticoagulants: Secondary | ICD-10-CM | POA: Diagnosis not present

## 2021-11-25 DIAGNOSIS — E119 Type 2 diabetes mellitus without complications: Secondary | ICD-10-CM | POA: Diagnosis not present

## 2021-11-25 DIAGNOSIS — Z8249 Family history of ischemic heart disease and other diseases of the circulatory system: Secondary | ICD-10-CM | POA: Diagnosis not present

## 2021-11-25 DIAGNOSIS — Z7982 Long term (current) use of aspirin: Secondary | ICD-10-CM | POA: Diagnosis not present

## 2021-11-25 DIAGNOSIS — Y92009 Unspecified place in unspecified non-institutional (private) residence as the place of occurrence of the external cause: Secondary | ICD-10-CM | POA: Diagnosis not present

## 2021-11-25 DIAGNOSIS — E11649 Type 2 diabetes mellitus with hypoglycemia without coma: Secondary | ICD-10-CM | POA: Diagnosis present

## 2021-11-25 DIAGNOSIS — I10 Essential (primary) hypertension: Secondary | ICD-10-CM | POA: Diagnosis present

## 2021-11-25 DIAGNOSIS — H5461 Unqualified visual loss, right eye, normal vision left eye: Secondary | ICD-10-CM | POA: Diagnosis present

## 2021-11-25 DIAGNOSIS — I4891 Unspecified atrial fibrillation: Secondary | ICD-10-CM | POA: Diagnosis present

## 2021-11-25 DIAGNOSIS — W11XXXA Fall on and from ladder, initial encounter: Secondary | ICD-10-CM | POA: Diagnosis present

## 2021-11-25 DIAGNOSIS — W19XXXA Unspecified fall, initial encounter: Secondary | ICD-10-CM | POA: Diagnosis not present

## 2021-11-25 DIAGNOSIS — I951 Orthostatic hypotension: Secondary | ICD-10-CM | POA: Diagnosis not present

## 2021-11-25 DIAGNOSIS — R2681 Unsteadiness on feet: Secondary | ICD-10-CM | POA: Diagnosis not present

## 2021-11-25 DIAGNOSIS — E7800 Pure hypercholesterolemia, unspecified: Secondary | ICD-10-CM | POA: Diagnosis present

## 2021-11-25 DIAGNOSIS — M7989 Other specified soft tissue disorders: Secondary | ICD-10-CM | POA: Diagnosis present

## 2021-11-25 DIAGNOSIS — S40022A Contusion of left upper arm, initial encounter: Secondary | ICD-10-CM | POA: Diagnosis present

## 2021-11-25 LAB — HEMOGLOBIN A1C
Hgb A1c MFr Bld: 7.8 % — ABNORMAL HIGH (ref 4.8–5.6)
Mean Plasma Glucose: 177.16 mg/dL

## 2021-11-25 LAB — CBC
HCT: 31.5 % — ABNORMAL LOW (ref 39.0–52.0)
Hemoglobin: 10.1 g/dL — ABNORMAL LOW (ref 13.0–17.0)
MCH: 31.2 pg (ref 26.0–34.0)
MCHC: 32.1 g/dL (ref 30.0–36.0)
MCV: 97.2 fL (ref 80.0–100.0)
Platelets: 227 10*3/uL (ref 150–400)
RBC: 3.24 MIL/uL — ABNORMAL LOW (ref 4.22–5.81)
RDW: 13.8 % (ref 11.5–15.5)
WBC: 8.4 10*3/uL (ref 4.0–10.5)
nRBC: 0 % (ref 0.0–0.2)

## 2021-11-25 MED ORDER — SODIUM CHLORIDE 0.9 % IV BOLUS
1000.0000 mL | Freq: Once | INTRAVENOUS | Status: AC
  Administered 2021-11-25: 1000 mL via INTRAVENOUS

## 2021-11-25 MED ORDER — HYDROCODONE-ACETAMINOPHEN 5-325 MG PO TABS
1.0000 | ORAL_TABLET | Freq: Four times a day (QID) | ORAL | Status: DC | PRN
Start: 2021-11-25 — End: 2021-11-27
  Administered 2021-11-25 – 2021-11-27 (×7): 1 via ORAL
  Filled 2021-11-25 (×7): qty 1

## 2021-11-25 NOTE — Plan of Care (Signed)
?  Problem: Acute Rehab PT Goals(only PT should resolve) ?Goal: Patient Will Transfer Sit To/From Stand ?Outcome: Progressing ?Flowsheets (Taken 11/25/2021 1011) ?Patient will transfer sit to/from stand: with modified independence ?Goal: Pt Will Transfer Bed To Chair/Chair To Bed ?Outcome: Progressing ?Flowsheets (Taken 11/25/2021 1011) ?Pt will Transfer Bed to Chair/Chair to Bed: with modified independence ?Goal: Pt Will Ambulate ?Outcome: Progressing ?Flowsheets (Taken 11/25/2021 1011) ?Pt will Ambulate: ? 25 feet ? with modified independence ? with least restrictive assistive device ?Goal: Pt/caregiver will Perform Home Exercise Program ?Outcome: Progressing ?Flowsheets (Taken 11/25/2021 1011) ?Pt/caregiver will Perform Home Exercise Program: ? For increased strengthening ? For improved balance ? Independently ? 10:11 AM, 11/25/21 ?Mearl Latin PT, DPT ?Physical Therapist at [facility] ?[facility] ? ?

## 2021-11-25 NOTE — TOC Initial Note (Signed)
Transition of Care (TOC) - Initial/Assessment Note  ? ? ?Patient Details  ?Name: Curtis Morris ?MRN: [id number] ?Date of Birth: [birth year] ? ?Transition of Care (TOC) CM/SW Contact:    ?Iona Beard, LCSWA ?Phone Number: ?11/25/2021, 11:28 AM ? ?Clinical Narrative:                 ?CSW updated that PT is recommending HH PT. CSW spoke with pts wife who was present with husband about this recommendation. She is agreeable to [facility] referral for pt. CSW reached out to [place] with [place] who will review. CSW requested that MD place [facility] orders. TOC to follow.  ? ?Expected Discharge Plan: [place] ?Barriers to Discharge: Continued Medical Work up ? ? ?Patient Goals and CMS Choice ?Patient states their goals for this hospitalization and ongoing recovery are:: Home with HH ?CMS Medicare.gov Compare Post Acute Care list provided to:: Patient ?Choice offered to / list presented to : Patient, Spouse ? ?Expected Discharge Plan and Services ?Expected Discharge Plan: [place] ?In-house Referral: Clinical Social Work ?  ?Post Acute Care Choice: Home Health ?Living arrangements for the past 2 months: [place] ?                ?  ?  ?  ?  ?  ?  ?  ?  ?  ?  ? ?Prior Living Arrangements/Services ?Living arrangements for the past 2 months: [place] ?Lives with:: Self, Spouse ?Patient language and need for interpreter reviewed:: Yes ?Do you feel safe going back to the place where you live?: Yes      ?Need for Family Participation in Patient Care: Yes (Comment) ?Care giver support system in place?: Yes (comment) ?  ?Criminal Activity/Legal Involvement Pertinent to Current Situation/Hospitalization: No - Comment as needed ? ?Activities of Daily Living ?Home Assistive Devices/Equipment: None ?ADL Screening (condition at time of admission) ?Patient's cognitive ability adequate to safely complete daily activities?: Yes ?Is the patient deaf or have difficulty hearing?: No ?Does the  patient have difficulty seeing, even when wearing glasses/contacts?: No ?Does the patient have difficulty concentrating, remembering, or making decisions?: No ?Patient able to express need for assistance with ADLs?: Yes ?Does the patient have difficulty dressing or bathing?: Yes ?Independently performs ADLs?: Yes (appropriate for developmental age) ?Does the patient have difficulty walking or climbing stairs?: No ?Weakness of Legs: None ?Weakness of Arms/Hands: Both ? ?Permission Sought/Granted ?  ?  ?   ?   ?   ?   ? ?Emotional Assessment ?Appearance:: Appears stated age ?Attitude/Demeanor/Rapport: Engaged ?Affect (typically observed): Accepting ?Orientation: : Oriented to Self, Oriented to Place, Oriented to  Time, Oriented to Situation ?Alcohol / Substance Use: Not Applicable ?Psych Involvement: No (comment) ? ?Admission diagnosis:  Fall [W19.XXXA] ?Fall, initial encounter [W19.XXXA] ?Contusion of left upper extremity, initial encounter [S40.022A] ?Patient Active Problem List  ? Diagnosis Date Noted  ? Fall 11/24/2021  ? Atrial fibrillation ([place]) 11/24/2021  ? Hematoma of arm, left, initial encounter 11/24/2021  ? Diabetes mellitus ([place]) 11/24/2021  ? Essential hypertension 11/24/2021  ? ?PCP:  [facility] [place]:   ?[id number] [place] [address] ?[place] [address] ?Phone: [phone] Fax: [phone] ? ? ? ? ?Social Determinants of Health (SDOH) Interventions ?  ? ?Readmission Risk Interventions ?   ? View : No data to display.  ?  ?  ?  ? ? ? ?

## 2021-11-25 NOTE — Consult Note (Signed)
? ?ORTHOPAEDIC CONSULTATION ? ?REQUESTING PHYSICIAN: Little Ishikawa, MD ? ?ASSESSMENT AND PLAN: ?81 y.o. male with the following: Left upper arm hematoma with diffuse swelling. ? ?Initial concern for evolving hematoma and potential for compartment syndrome.  Compartments are soft at this time.  He does have some skin tearing, and obvious oozing.  Recommend local wound care, and gentle compression to minimize the development of a further hematoma.  Nothing further is needed from an orthopedic perspective.  However, if his symptoms worsen or another symptom develops, I am available for reevaluation. ? ?Orthopedics recommends admission to a medical service and we will provide consultation and follow along ? ?- Weight Bearing Status/Activity: WBAT, ROM as tolerated ? ?- Additional recommended labs/tests: None ? ?-VTE Prophylaxis: Resume chronic anticoagulation, as soon as it is deemed safe ? ?- Pain control: As needed ? ?- Follow-up plan: To be determined ? ?-Procedures: None ? ?Chief Complaint: Left arm swelling ? ?HPI: ?Curtis Morris is a 81 y.o. male who presented after a fall, for evaluation of left arm pain.  He was doing some work at his home, when he stumbled and fell.  He states he lost his balance and fell down 3 steps.  He struck his arm and hit his head on the way down.  He has noted extreme swelling in the left upper extremity.  The pain was progressively worsening.  He presented to the ED for evaluation as a result.  No numbness or tingling.  He has been complaining of some nausea.  He is also been dizzy when he stands up. ? ?Past Medical History:  ?Diagnosis Date  ? A-fib ([place])   ? Diabetes mellitus without complication ([place])   ? High cholesterol   ? Hypertension   ? ?Past Surgical History:  ?Procedure Laterality Date  ? ABDOMINAL SURGERY    ? APPENDECTOMY    ? SPLENECTOMY, TOTAL    ? ?Social History  ? ?Socioeconomic History  ? Marital status: Married  ?  Spouse name: Not on file  ? Number of  children: Not on file  ? Years of education: Not on file  ? Highest education level: Not on file  ?Occupational History  ? Not on file  ?Tobacco Use  ? Smoking status: Former  ?  Types: Cigarettes  ? Smokeless tobacco: Never  ?Substance and Sexual Activity  ? Alcohol use: No  ? Drug use: No  ? Sexual activity: Not on file  ?Other Topics Concern  ? Not on file  ?Social History Narrative  ? Not on file  ? ?Social Determinants of Health  ? ?Financial Resource Strain: Not on file  ?Food Insecurity: Not on file  ?Transportation Needs: Not on file  ?Physical Activity: Not on file  ?Stress: Not on file  ?Social Connections: Not on file  ? ?History reviewed. No pertinent family history. ?No Known Allergies ?Prior to Admission medications   ?Medication Sig Start Date End Date Taking? Authorizing Provider  ?aspirin EC 81 MG tablet Take 81 mg by mouth daily. Swallow whole.   Yes [provider]  ?ATORVASTATIN CALCIUM PO Take 20 mg by mouth at bedtime.   Yes [provider]  ?Calcium Carbonate-Vitamin D (CALCIUM-VITAMIN D3 PO) Take 1 tablet by mouth 2 (two) times daily.   Yes [provider]  ?lipase/protease/amylase (CREON) 12000 units CPEP capsule Take 12,000 Units by mouth 2 (two) times daily.   Yes [provider]  ?magnesium oxide (MAG-OX) 400 MG tablet Take 400 mg by  mouth daily.   Yes [provider]  ?metFORMIN (GLUCOPHAGE) 500 MG tablet Take 500 mg by mouth every evening.   Yes [provider]  ?metoprolol tartrate (LOPRESSOR) 100 MG tablet Take 50 mg by mouth 2 (two) times daily.   Yes [provider]  ?pantoprazole (PROTONIX) 40 MG tablet Take 40 mg by mouth 2 (two) times daily.   Yes [provider]  ?tamsulosin (FLOMAX) 0.4 MG CAPS capsule Take 0.4 mg by mouth at bedtime.   Yes [provider]  ?apixaban (ELIQUIS) 5 MG TABS tablet Take 5 mg by mouth 2 (two) times daily.    [provider]  ?cyclobenzaprine (FLEXERIL) 10 MG  tablet Take 10 mg by mouth at bedtime.    [provider]  ?ondansetron (ZOFRAN ODT) 4 MG disintegrating tablet Take 1 tablet (4 mg total) by mouth every 8 (eight) hours as needed for nausea or vomiting. ?Patient not taking: Reported on 11/24/2021 08/04/18   Little, Wenda Overland, MD  ?oxyCODONE-acetaminophen (PERCOCET) 5-325 MG tablet Take 1-2 tablets by mouth every 4 (four) hours as needed. ?Patient not taking: Reported on 11/24/2021 08/11/17   Nat Christen, MD  ?sucralfate (CARAFATE) 1 g tablet Take 1 tablet (1 g total) by mouth 4 (four) times daily -  with meals and at bedtime for 7 days. ?Patient not taking: Reported on 11/24/2021 08/04/18 08/11/18  Little, Wenda Overland, MD  ? ?CT Head Wo Contrast ? ?Result Date: 11/24/2021 ?CLINICAL DATA:  Head injury after fall. EXAM: CT HEAD WITHOUT CONTRAST CT CERVICAL SPINE WITHOUT CONTRAST TECHNIQUE: Multidetector CT imaging of the head and cervical spine was performed following the standard protocol without intravenous contrast. Multiplanar CT image reconstructions of the cervical spine were also generated. RADIATION DOSE REDUCTION: This exam was performed according to the departmental dose-optimization program which includes automated exposure control, adjustment of the mA and/or kV according to patient size and/or use of iterative reconstruction technique. COMPARISON:  None available currently. FINDINGS: CT HEAD FINDINGS Brain: Mild chronic ischemic white matter disease is noted. No mass effect or midline shift is noted. Ventricular size is within normal limits. There is no evidence of mass lesion, hemorrhage or acute infarction. Vascular: No hyperdense vessel or unexpected calcification. Skull: Normal. Negative for fracture or focal lesion. Sinuses/Orbits: Left frontal and ethmoid sinusitis is noted. Other: None. CT CERVICAL SPINE FINDINGS Alignment: Normal. Skull base and vertebrae: No acute fracture. No primary bone lesion or focal pathologic process. Soft tissues  and spinal canal: No prevertebral fluid or swelling. No visible canal hematoma. Disc levels: Mild degenerative disc disease is noted at C4-5. Severe degenerative disc disease is noted at C6-7. Upper chest: Negative. Other: None. IMPRESSION: No acute intracranial abnormality seen. 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 No acute abnormality is noted. Electronically Signed   By: Marijo Conception M.D.   On: 11/24/2021 16:46  ? ?CT Cervical Spine Wo Contrast ? ?Result Date: 11/24/2021 ?CLINICAL DATA:  Head injury after fall. EXAM: CT HEAD WITHOUT CONTRAST CT CERVICAL SPINE WITHOUT CONTRAST TECHNIQUE: Multidetector CT imaging of the head and cervical spine was performed following the standard protocol without intravenous contrast. Multiplanar CT image reconstructions of the cervical spine were also generated. RADIATION DOSE REDUCTION: This exam was performed according to the departmental dose-optimization program which includes automated exposure control, adjustment of the mA and/or kV according to patient size and/or use of iterative reconstruction technique. COMPARISON:  None available currently. FINDINGS: CT HEAD FINDINGS Brain: Mild chronic ischemic  white matter disease is noted. No mass effect or midline shift is noted. Ventricular size is within normal limits. There is no evidence of mass lesion, hemorrhage or acute infarction. Vascular: No hyperdense vessel or unexpected calcification. Skull: Normal. Negative for fracture or focal lesion. Sinuses/Orbits: Left frontal and ethmoid sinusitis is noted. Other: None. CT CERVICAL SPINE FINDINGS Alignment: Normal. Skull base and vertebrae: No acute fracture. No primary bone lesion or focal pathologic process. Soft tissues and spinal canal: No prevertebral fluid or swelling. No visible canal hematoma. Disc levels: Mild degenerative disc disease is noted at C4-5. Severe degenerative disc disease is noted at C6-7. Upper chest: Negative. Other:  None. IMPRESSION: No acute intracranial abnormality seen. 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 No acute abnormality is noted. Electronically Signed   By: Marijo Conception M.D.   O

## 2021-11-25 NOTE — Progress Notes (Signed)
?PROGRESS NOTE ? ? ? ?Curtis Morris  XBD:[id number] DOB: 13-Apr-1941 DOA: 11/24/2021 ?PCP: [facility] ? ? ?Brief Narrative:  ?Curtis Morris is a 81 y.o. male with medical history significant for atrial fibrillation on chronic anticoagulation with Xarelto, non-insulin-dependent diabetes mellitus, hypertension. Patient presented from home after mechanical fall off a ladder with substantially large left upper extremity hematoma, initially concerning for compartment syndrome but evaluated and found to be vascularly intact distally to the hematoma. ? ? ?Assessment & Plan: ?  ?Principal Problem: ?  Fall ?Active Problems: ?  Atrial fibrillation (HCC) ?  Hematoma of arm, left, initial encounter ?  Diabetes mellitus (HCC) ?  Essential hypertension ? ? ?Large hematoma of arm, left, initial encounter, without compartment syndrome, POA ?Secondary to mechanical fall likely complicated by orthostatics, acute on recurrent ?-Distal blood flow intact, continue dressing changes per recommendations by wound care ?-Hold liquids ?-Repeat H&H downtrending as expected given anticoagulation ?-No current indication for intervention at this time ?-Continue supportive care, warm compresses, loose bandage, avoid overly tightened bandages or tape ?-Discontinue IV pain medication given below ?-PT OT to follow, patient markedly unstable on his feet today ?-Head and cervical CT without acute abnormality. ?  ?Profound orthostatic hypotension  ?-Patient evaluated today at bedside with PT with profound orthostatic hypotension and nausea vomiting and unsteady gait ?-IV fluid challenge ongoing ?-Symptoms currently well controlled at rest while seated ? ?Essential hypertension ?-Borderline hypotensive today, hold metoprolol ?  ?Diabetes mellitus (HCC), non-insulin-dependent, uncontrolled ?- HgbA1c elevated at 7.8 ?-Continue sliding scale insulin, hypoglycemic protocol, hold home metformin in the interim ?  ?Atrial fibrillation  (HCC) ?Rate controlled -hold Eliquis; restart date pending left upper extremity hematoma resolution, likely 1 to 2 weeks in estimation ?- Resume Metoprolol in a.m. ? ?DVT prophylaxis: None given above ?Code Status: Full ?Family Communication: At bedside ? ?Status is: Inpatient ? ?Dispo: The patient is from: Home ?             Anticipated d/c is to: To be determined ?             Anticipated d/c date is: 48 to 72 hours ?             Patient currently not medically stable for discharge ? ?Consultants:  ?None ? ?Procedures:  ?None ? ?Antimicrobials:  ?None ? ?Subjective: ?No acute issues or events overnight, on attempted to stand and walk today patient became profoundly orthostatic with episodes of nausea vomiting and ongoing hypotension.  Otherwise denies chest pain fevers chills diarrhea shortness of breath. ? ?Objective: ?Vitals:  ? 11/25/21 0604 11/25/21 0808 11/25/21 0941 11/25/21 1133  ?BP: 106/64 (!) 80/61 (!) 77/26 111/68  ?Pulse: 95 84 (!) 43 99  ?Resp: 18     ?Temp: 97.6 ?F (36.4 ?C)   (!) 97.4 ?F (36.3 ?C)  ?TempSrc: Oral Oral Oral Oral  ?SpO2: 96% 100% 98% 99%  ?Weight:      ?Height:      ? ? ?Intake/Output Summary (Last 24 hours) at 11/25/2021 1145 ?Last data filed at 11/25/2021 8341 ?Gross per 24 hour  ?Intake 240 ml  ?Output 300 ml  ?Net -60 ml  ? ?Filed Weights  ? 11/24/21 1518  ?Weight: 63.5 kg  ? ? ?Examination: ? ?General:  Pleasantly resting in bed, No acute distress. ?HEENT:  Normocephalic atraumatic.  Sclerae nonicteric, noninjected.  Extraocular movements intact bilaterally. ?Neck:  Without mass or deformity.  Trachea is midline. ?Lungs:  Clear to auscultate bilaterally without  rhonchi, wheeze, or rales. ?Heart:  Regular rate and rhythm.  Without murmurs, rubs, or gallops. ?Abdomen:  Soft, nontender, nondistended.  Without guarding or rebound. ?Extremities: Large hematoma left medial upper extremity proximal to the elbow with notable 5 cm skin laceration ?Skin: Large circumferential ecchymosis of  the left upper extremity extending distally to the left forearm, demarcation stable from yesterday's outline ? ? ?Data Reviewed: I have personally reviewed following labs and imaging studies ? ?CBC: ?Recent Labs  ?Lab 11/24/21 ?1746 11/24/21 ?2152 11/25/21 ?0446  ?WBC 7.9  --  8.4  ?NEUTROABS 4.2  --   --   ?HGB 13.3 11.1* 10.1*  ?HCT 40.3 34.1* 31.5*  ?MCV 97.1  --  97.2  ?PLT 302  --  227  ? ?Basic Metabolic Panel: ?Recent Labs  ?Lab 11/24/21 ?1746  ?NA 137  ?K 3.8  ?CL 104  ?CO2 28  ?GLUCOSE 115*  ?BUN 13  ?CREATININE 0.93  ?CALCIUM 9.0  ? ?GFR: ?Estimated Creatinine Clearance: 57.8 mL/min (by C-G formula based on SCr of 0.93 mg/dL). ?Liver Function Tests: ?No results for input(s): AST, ALT, ALKPHOS, BILITOT, PROT, ALBUMIN in the last 168 hours. ?No results for input(s): LIPASE, AMYLASE in the last 168 hours. ?No results for input(s): AMMONIA in the last 168 hours. ?Coagulation Profile: ?No results for input(s): INR, PROTIME in the last 168 hours. ?Cardiac Enzymes: ?No results for input(s): CKTOTAL, CKMB, CKMBINDEX, TROPONINI in the last 168 hours. ?BNP (last 3 results) ?No results for input(s): PROBNP in the last 8760 hours. ?HbA1C: ?Recent Labs  ?  11/24/21 ?2152  ?HGBA1C 7.8*  ? ?CBG: ?No results for input(s): GLUCAP in the last 168 hours. ?Lipid Profile: ?No results for input(s): CHOL, HDL, LDLCALC, TRIG, CHOLHDL, LDLDIRECT in the last 72 hours. ?Thyroid Function Tests: ?No results for input(s): TSH, T4TOTAL, FREET4, T3FREE, THYROIDAB in the last 72 hours. ?Anemia Panel: ?No results for input(s): VITAMINB12, FOLATE, FERRITIN, TIBC, IRON, RETICCTPCT in the last 72 hours. ?Sepsis Labs: ?No results for input(s): PROCALCITON, LATICACIDVEN in the last 168 hours. ? ?No results found for this or any previous visit (from the past 240 hour(s)).  ? ? ? ? ? ?Radiology Studies: ?CT Head Wo Contrast ? ?Result Date: 11/24/2021 ?CLINICAL DATA:  Head injury after fall. EXAM: CT HEAD WITHOUT CONTRAST CT CERVICAL SPINE WITHOUT  CONTRAST TECHNIQUE: Multidetector CT imaging of the head and cervical spine was performed following the standard protocol without intravenous contrast. Multiplanar CT image reconstructions of the cervical spine were also generated. RADIATION DOSE REDUCTION: This exam was performed according to the departmental dose-optimization program which includes automated exposure control, adjustment of the mA and/or kV according to patient size and/or use of iterative reconstruction technique. COMPARISON:  None available currently. FINDINGS: CT HEAD FINDINGS Brain: Mild chronic ischemic white matter disease is noted. No mass effect or midline shift is noted. Ventricular size is within normal limits. There is no evidence of mass lesion, hemorrhage or acute infarction. Vascular: No hyperdense vessel or unexpected calcification. Skull: Normal. Negative for fracture or focal lesion. Sinuses/Orbits: Left frontal and ethmoid sinusitis is noted. Other: None. CT CERVICAL SPINE FINDINGS Alignment: Normal. Skull base and vertebrae: No acute fracture. No primary bone lesion or focal pathologic process. Soft tissues and spinal canal: No prevertebral fluid or swelling. No visible canal hematoma. Disc levels: Mild degenerative disc disease is noted at C4-5. Severe degenerative disc disease is noted at C6-7. Upper chest: Negative. Other: None. IMPRESSION: No acute intracranial abnormality seen. Multilevel degenerative disc disease is  noted in the cervical spine. No acute abnormality is noted. Electronically Signed   By: Lupita RaiderJames  Green Jr M.D.   On: 11/24/2021 16:46  ? ?CT Cervical Spine Wo Contrast ? ?Result Date: 11/24/2021 ?CLINICAL DATA:  Head injury after fall. EXAM: CT HEAD WITHOUT CONTRAST CT CERVICAL SPINE WITHOUT CONTRAST TECHNIQUE: Multidetector CT imaging of the head and cervical spine was performed following the standard protocol without intravenous contrast. Multiplanar CT image reconstructions of the cervical spine were also  generated. RADIATION DOSE REDUCTION: This exam was performed according to the departmental dose-optimization program which includes automated exposure control, adjustment of the mA and/or kV according to patient size

## 2021-11-25 NOTE — Progress Notes (Signed)
?  Transition of Care (TOC) Screening Note ? ? ?Patient Details  ?Name: Curtis Morris ?Date of Birth: 1940-08-14 ? ? ?Transition of Care (TOC) CM/SW Contact:    ?Villa Herb, LCSWA ?Phone Number: ?11/25/2021, 8:25 AM ? ?[facility] notified of hospital admission. Notification ID is [id number]. ? ?Transition of Care Department [facility]) has reviewed patient and no TOC needs have been identified at this time. We will continue to monitor patient advancement through interdisciplinary progression rounds. If new patient transition needs arise, please place a TOC consult. ?  ?

## 2021-11-25 NOTE — Evaluation (Signed)
Physical Therapy Evaluation ?Patient Details ?Name: Curtis Morris ?MRN: [id number] ?DOB: Jul 23, 1941 ?Today's Date: 11/25/2021 ? ?History of Present Illness ? Curtis Morris is a 81 y.o. male with medical history significant for atrial fibrillation on chronic anticoagulation with Xarelto, diabetes mellitus, hypertension.  Presented to the ED with complaints of a fall.  Patient was standing on a ladder painting when he fell off down 3 steps.  He fell backwards hitting the left side of his head, his left arm, and left wrist area.  He sustained bruising to his arm.  He reports 2-3 falls since January due to initial dizziness that occurs when he suddenly gets up from a sitting or lying position.  Dizziness usually resolves quickly, does not occur if he takes his time getting up.  He is blind in his right eye.  He is on Eliquis and his last dose was this morning. ? ?  ?Clinical Impression ? Patient limited for functional mobility as stated below secondary to BLE and LUE weakness, pain, nausea and balance deficits. Patient does not require assist with bed mobility but immediately c/o dizziness and nausea with dry heaving. Patient demonstrates good sitting tolerance and sitting balance EOB with decrease in symptoms with time sitting. He transfers to standing without physical assist and ambulates to chair at bedside with unilateral UE support on chair/bed for balance and LE strength deficits. Patient's LUE with severe pain and edema limiting use.  Patient will benefit from continued physical therapy in hospital and recommended venue below to increase strength, balance, endurance for safe ADLs and gait. ?   ?   ? ?Recommendations for follow up therapy are one component of a multi-disciplinary discharge planning process, led by the attending physician.  Recommendations may be updated based on patient status, additional functional criteria and insurance authorization. ? ?Follow Up Recommendations Home health PT ? ?  ?Assistance  Recommended at Discharge PRN  ?Patient can return home with the following ? Assistance with cooking/housework;Help with stairs or ramp for entrance;A little help with walking and/or transfers ? ?  ?Equipment Recommendations None recommended by PT  ?Recommendations for Other Services ?    ?  ?Functional Status Assessment Patient has had a recent decline in their functional status and demonstrates the ability to make significant improvements in function in a reasonable and predictable amount of time.  ? ?  ?Precautions / Restrictions Precautions ?Precautions: Fall ?Restrictions ?Weight Bearing Restrictions: No  ? ?  ? ?Mobility ? Bed Mobility ?Overal bed mobility: Modified Independent ?  ?  ?  ?  ?  ?  ?General bed mobility comments: labored ?  ? ?Transfers ?Overall transfer level: Needs assistance ?Equipment used: None ?Transfers: Sit to/from Stand, Bed to chair/wheelchair/BSC ?Sit to Stand: Min guard ?  ?Step pivot transfers: Min guard ?  ?  ?  ?  ?  ? ?Ambulation/Gait ?Ambulation/Gait assistance: Min guard ?Gait Distance (Feet): 4 Feet ?Assistive device: None ?Gait Pattern/deviations: Trunk flexed, Knee flexed in stance - right, Knee flexed in stance - left ?Gait velocity: decrease ?  ?  ?General Gait Details: labored, reaching to chair/bed for support to ambualte to chair at bedside ? ?Stairs ?  ?  ?  ?  ?  ? ?Wheelchair Mobility ?  ? ?Modified Rankin (Stroke Patients Only) ?  ? ?  ? ?Balance Overall balance assessment: Needs assistance ?Sitting-balance support: No upper extremity supported, Feet supported ?Sitting balance-Leahy Scale: Good ?Sitting balance - Comments: seated EOB ?  ?Standing balance support: No  upper extremity supported ?Standing balance-Leahy Scale: Fair ?Standing balance comment: without AD ?  ?  ?  ?  ?  ?  ?  ?  ?  ?  ?  ?   ? ? ? ?Pertinent Vitals/Pain Pain Assessment ?Pain Assessment: Faces ?Faces Pain Scale: Hurts whole lot ?Pain Location: L Arm ?Pain Descriptors / Indicators: Sharp ?Pain  Intervention(s): Limited activity within patient's tolerance, Monitored during session, Repositioned  ? ? ?Home Living Family/patient expects to be discharged to:: Private residence ?Living Arrangements: Spouse/significant other ?Available Help at Discharge: Family ?Type of Home: House ?Home Access: Stairs to enter ?Entrance Stairs-Rails: Right;Left ?Entrance Stairs-Number of Steps: 3 ?  ?Home Layout: One level ?Home Equipment: Conservation officer, nature (2 wheels);Cane - single point;Shower seat ?   ?  ?Prior Function Prior Level of Function : Independent/Modified Independent ?  ?  ?  ?  ?  ?  ?Mobility Comments: patient states community ambualtion without AD ?ADLs Comments: independent ?  ? ? ?Hand Dominance  ?   ? ?  ?Extremity/Trunk Assessment  ? Upper Extremity Assessment ?Upper Extremity Assessment: Generalized weakness;LUE deficits/detail ?LUE Deficits / Details: Edema, brusing ?LUE: Unable to fully assess due to pain ?  ? ?Lower Extremity Assessment ?Lower Extremity Assessment: Generalized weakness ?  ? ?Cervical / Trunk Assessment ?Cervical / Trunk Assessment: Normal  ?Communication  ? Communication: No difficulties  ?Cognition Arousal/Alertness: Awake/alert ?Behavior During Therapy: [facility] for tasks assessed/performed ?Overall Cognitive Status: Within Functional Limits for tasks assessed ?  ?  ?  ?  ?  ?  ?  ?  ?  ?  ?  ?  ?  ?  ?  ?  ?  ?  ?  ? ?  ?General Comments   ? ?  ?Exercises    ? ?Assessment/Plan  ?  ?PT Assessment Patient needs continued PT services  ?PT Problem List Decreased strength;Decreased mobility;Decreased range of motion;Decreased activity tolerance;Decreased balance;Decreased skin integrity;Pain ? ?   ?  ?PT Treatment Interventions DME instruction;Therapeutic exercise;Gait training;Balance training;Stair training;Neuromuscular re-education;Functional mobility training;Therapeutic activities;Patient/family education   ? ?PT Goals (Current goals can be found in the Care Plan section)  ?Acute Rehab PT  Goals ?Patient Stated Goal: Return home ?PT Goal Formulation: With patient ?Time For Goal Achievement: 12/02/21 ?Potential to Achieve Goals: Good ? ?  ?Frequency Min 3X/week ?  ? ? ?Co-evaluation   ?  ?  ?  ?  ? ? ?  ?AM-PAC PT "6 Clicks" Mobility  ?Outcome Measure Help needed turning from your back to your side while in a flat bed without using bedrails?: None ?Help needed moving from lying on your back to sitting on the side of a flat bed without using bedrails?: None ?Help needed moving to and from a bed to a chair (including a wheelchair)?: A Little ?Help needed standing up from a chair using your arms (e.g., wheelchair or bedside chair)?: A Little ?Help needed to walk in hospital room?: A Little ?Help needed climbing 3-5 steps with a railing? : A Little ?6 Click Score: 20 ? ?  ?End of Session   ?Activity Tolerance: Patient tolerated treatment well;Patient limited by pain ?Patient left: in chair;with call bell/phone within reach;with nursing/sitter in room ?Nurse Communication: Mobility status ?PT Visit Diagnosis: Unsteadiness on feet (R26.81);Other abnormalities of gait and mobility (R26.89);Muscle weakness (generalized) (M62.81);History of falling (Z91.81) ?  ? ?Time: [id number] ?PT Time Calculation (min) (ACUTE ONLY): 24 min ? ? ?Charges:   PT Evaluation ?$PT Eval Low Complexity:  1 Low ?PT Treatments ?$Therapeutic Activity: 8-22 mins ?  ?   ? ? ?10:09 AM, 11/25/21 ?Mearl Latin PT, DPT ?Physical Therapist at [facility] ?[facility] ? ? ?

## 2021-11-26 ENCOUNTER — Inpatient Hospital Stay (HOSPITAL_COMMUNITY): Payer: No Typology Code available for payment source

## 2021-11-26 LAB — CBC
HCT: 28 % — ABNORMAL LOW (ref 39.0–52.0)
Hemoglobin: 9.1 g/dL — ABNORMAL LOW (ref 13.0–17.0)
MCH: 31.8 pg (ref 26.0–34.0)
MCHC: 32.5 g/dL (ref 30.0–36.0)
MCV: 97.9 fL (ref 80.0–100.0)
Platelets: 225 10*3/uL (ref 150–400)
RBC: 2.86 MIL/uL — ABNORMAL LOW (ref 4.22–5.81)
RDW: 14.1 % (ref 11.5–15.5)
WBC: 9.5 10*3/uL (ref 4.0–10.5)
nRBC: 0 % (ref 0.0–0.2)

## 2021-11-26 LAB — BASIC METABOLIC PANEL
Anion gap: 4 — ABNORMAL LOW (ref 5–15)
BUN: 12 mg/dL (ref 8–23)
CO2: 27 mmol/L (ref 22–32)
Calcium: 7.9 mg/dL — ABNORMAL LOW (ref 8.9–10.3)
Chloride: 107 mmol/L (ref 98–111)
Creatinine, Ser: 0.87 mg/dL (ref 0.61–1.24)
GFR, Estimated: >60 mL/min (ref >60)
Glucose, Bld: 157 mg/dL — ABNORMAL HIGH (ref 70–99)
Potassium: 4.1 mmol/L (ref 3.5–5.1)
Sodium: 138 mmol/L (ref 135–145)

## 2021-11-26 MED ORDER — IOHEXOL 350 MG/ML SOLN
100.0000 mL | Freq: Once | INTRAVENOUS | Status: AC | PRN
  Administered 2021-11-26: 100 mL via INTRAVENOUS

## 2021-11-26 NOTE — Progress Notes (Addendum)
?PROGRESS NOTE ? ? ? ?Curtis Morris  [id number] DOB: 1941-03-18 DOA: 11/24/2021 ?PCP: [facility] [place] ? ? ?Brief Narrative:  ?Curtis Morris is a 81 y.o. male with medical history significant for atrial fibrillation on chronic anticoagulation with Xarelto, non-insulin-dependent diabetes mellitus, hypertension. Patient presented from home after mechanical fall off a ladder with substantially large left upper extremity hematoma, initially concerning for compartment syndrome but evaluated and found to be vascularly intact distally to the hematoma. ? ?Assessment & Plan: ?  ?Principal Problem: ?  Fall ?Active Problems: ?  Atrial fibrillation ([place]) ?  Hematoma of arm, left, initial encounter ?  Diabetes mellitus ([place]) ?  Essential hypertension ? ? ?Large hematoma of arm, left, initial encounter, without compartment syndrome, POA ?Secondary to mechanical fall likely complicated by orthostatics, acute on recurrent ?-Distal blood flow intact, continue dressing changes per recommendations by wound care ?-Repeat H&H continues to downtrend despite cessation of anticoagulation ?-We will sideline interventional radiology for further plan of care if patient's hemoglobin continues to drop concerning for ongoing bleeding despite discontinuation of anticoagulation ?**Update, given ongoing concern for bleeding will get CTA left upper extremity and reverse Eliquis per discussion with IR.  Pending findings may need transfer to main campus for intervention if arterial bleed, if venous may likely benefit from cold compress and increased external pressure as tolerated ?-Continue supportive care, warm compresses, loose bandage, avoid overly tightened bandages or tape ?-PT OT to follow, patient markedly unstable on his feet 21st -see below ?-Head and cervical CT without acute abnormality. ?  ?Profound orthostatic hypotension, improving ?-Patient evaluated 21st with profound orthostatic hypotension and nausea vomiting and  unsteady gait ?-IV fluid challenge appears to be successful, markedly improving symptoms, PT OT to follow today for further recommendations likely discharge home with home health PT ?-Symptoms currently well controlled at rest while seated ? ?Essential hypertension ?-Continue to hold home metoprolol until patient's vitals recover from previous hypotension ?  ?Diabetes mellitus ([place]), non-insulin-dependent, uncontrolled ?- HgbA1c elevated at 7.8 ?-Continue sliding scale insulin, hypoglycemic protocol, hold home metformin in the interim ?  ?Atrial fibrillation ([place]) ?Rate controlled -hold Eliquis; restart date pending left upper extremity hematoma resolution, likely 1 to 2 weeks in estimation ?- Resume Metoprolol in the next 24-48h pending vitals ? ?DVT prophylaxis: None given above ?Code Status: Full ?Family Communication: At bedside ? ?Status is: Inpatient ? ?Dispo: The patient is from: Home ?             Anticipated d/c is to: To be determined ?             Anticipated d/c date is: 48 to 72 hours ?             Patient currently not medically stable for discharge ? ?Consultants:  ?None ? ?Procedures:  ?None ? ?Antimicrobials:  ?None ? ?Subjective: ?No acute issues or events overnight, denies nausea vomiting diarrhea constipation headache fevers chills or chest pain.  Arm pain drastically improving but not yet resolved, range of motion and grip strength continues to be weak compared to baseline ? ?Objective: ?Vitals:  ? 11/25/21 1530 11/25/21 2006 11/26/21 [id number] 11/26/21 0521  ?BP: 119/87 136/80 131/75   ?Pulse: (!) 110 100 (!) 114 98  ?Resp: 18 20    ?Temp:  98 ?F (36.7 ?C) 97.8 ?F (36.6 ?C)   ?TempSrc:  Oral Oral   ?SpO2: 98% 99% 95%   ?Weight:      ?Height:      ? ? ?  Intake/Output Summary (Last 24 hours) at 11/26/2021 0717 ?Last data filed at 11/26/2021 0500 ?Gross per 24 hour  ?Intake 0 ml  ?Output 800 ml  ?Net -800 ml  ? ? ?Filed Weights  ? 11/24/21 1518  ?Weight: 63.5 kg  ? ? ?Examination: ? ?General:  Pleasantly  resting in bed, No acute distress. ?HEENT:  Normocephalic atraumatic.  Sclerae nonicteric, noninjected.  Extraocular movements intact bilaterally. ?Neck:  Without mass or deformity.  Trachea is midline. ?Lungs:  Clear to auscultate bilaterally without rhonchi, wheeze, or rales. ?Heart:  Regular rate and rhythm.  Without murmurs, rubs, or gallops. ?Abdomen:  Soft, nontender, nondistended.  Without guarding or rebound. ?Extremities: Large hematoma left medial upper extremity proximal to the elbow with notable 5 cm skin laceration ?Skin: Large circumferential ecchymosis of the left upper extremity extending distally to the left forearm, demarcation stable from yesterday's outline ? ? ?Data Reviewed: I have personally reviewed following labs and imaging studies ? ?CBC: ?Recent Labs  ?Lab 11/24/21 ?1746 11/24/21 ?2152 11/25/21 ?0446 11/26/21 ?5397  ?WBC 7.9  --  8.4 9.5  ?NEUTROABS 4.2  --   --   --   ?HGB 13.3 11.1* 10.1* 9.1*  ?HCT 40.3 34.1* 31.5* 28.0*  ?MCV 97.1  --  97.2 97.9  ?PLT 302  --  227 225  ? ? ?Basic Metabolic Panel: ?Recent Labs  ?Lab 11/24/21 ?1746 11/26/21 ?6734  ?NA 137 138  ?K 3.8 4.1  ?CL 104 107  ?CO2 28 27  ?GLUCOSE 115* 157*  ?BUN 13 12  ?CREATININE 0.93 0.87  ?CALCIUM 9.0 7.9*  ? ? ?GFR: ?Estimated Creatinine Clearance: 61.8 mL/min (by C-G formula based on SCr of 0.87 mg/dL). ?Liver Function Tests: ?No results for input(s): AST, ALT, ALKPHOS, BILITOT, PROT, ALBUMIN in the last 168 hours. ?No results for input(s): LIPASE, AMYLASE in the last 168 hours. ?No results for input(s): AMMONIA in the last 168 hours. ?Coagulation Profile: ?No results for input(s): INR, PROTIME in the last 168 hours. ?Cardiac Enzymes: ?No results for input(s): CKTOTAL, CKMB, CKMBINDEX, TROPONINI in the last 168 hours. ?BNP (last 3 results) ?No results for input(s): PROBNP in the last 8760 hours. ?HbA1C: ?Recent Labs  ?  11/24/21 ?2152  ?HGBA1C 7.8*  ? ? ?CBG: ?No results for input(s): GLUCAP in the last 168 hours. ?Lipid  Profile: ?No results for input(s): CHOL, HDL, LDLCALC, TRIG, CHOLHDL, LDLDIRECT in the last 72 hours. ?Thyroid Function Tests: ?No results for input(s): TSH, T4TOTAL, FREET4, T3FREE, THYROIDAB in the last 72 hours. ?Anemia Panel: ?No results for input(s): VITAMINB12, FOLATE, FERRITIN, TIBC, IRON, RETICCTPCT in the last 72 hours. ?Sepsis Labs: ?No results for input(s): PROCALCITON, LATICACIDVEN in the last 168 hours. ? ?No results found for this or any previous visit (from the past 240 hour(s)).  ? ? ? ? ? ?Radiology Studies: ?CT Head Wo Contrast ? ?Result Date: 11/24/2021 ?CLINICAL DATA:  Head injury after fall. EXAM: CT HEAD WITHOUT CONTRAST CT CERVICAL SPINE WITHOUT CONTRAST TECHNIQUE: Multidetector CT imaging of the head and cervical spine was performed following the standard protocol without intravenous contrast. Multiplanar CT image reconstructions of the cervical spine were also generated. RADIATION DOSE REDUCTION: This exam was performed according to the departmental dose-optimization program which includes automated exposure control, adjustment of the mA and/or kV according to patient size and/or use of iterative reconstruction technique. COMPARISON:  None available currently. FINDINGS: CT HEAD FINDINGS Brain: Mild chronic ischemic white matter disease is noted. No mass effect or midline shift is noted. Ventricular  size is within normal limits. There is no evidence of mass lesion, hemorrhage or acute infarction. Vascular: No hyperdense vessel or unexpected calcification. Skull: Normal. Negative for fracture or focal lesion. Sinuses/Orbits: Left frontal and ethmoid sinusitis is noted. Other: None. CT CERVICAL SPINE FINDINGS Alignment: Normal. Skull base and vertebrae: No acute fracture. No primary bone lesion or focal pathologic process. Soft tissues and spinal canal: No prevertebral fluid or swelling. No visible canal hematoma. Disc levels: Mild degenerative disc disease is noted at C4-5. Severe degenerative  disc disease is noted at C6-7. Upper chest: Negative. Other: None. IMPRESSION: No acute intracranial abnormality seen. 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 No acute abnormali

## 2021-11-26 NOTE — Progress Notes (Signed)
Dressing changed to left arm wounds. Both skin tears with minimal bleeding noted, periwound area clean. Left upper arm swollen, ecchymotic and painful with movement but normal temp. ?Pt with good ROJM of left arm/shoulder, elbow and wrist. Pt keeping left arm elevated up on pillows. ?Pt states he is having severe left lower tooth pain, s/p cap removal last week. Pt states he feels like he is getting an abscess in this area. MD [facility] notified. ?

## 2021-11-27 LAB — CBC
HCT: 27.5 % — ABNORMAL LOW (ref 39.0–52.0)
Hemoglobin: 8.9 g/dL — ABNORMAL LOW (ref 13.0–17.0)
MCH: 31.3 pg (ref 26.0–34.0)
MCHC: 32.4 g/dL (ref 30.0–36.0)
MCV: 96.8 fL (ref 80.0–100.0)
Platelets: 236 10*3/uL (ref 150–400)
RBC: 2.84 MIL/uL — ABNORMAL LOW (ref 4.22–5.81)
RDW: 13.9 % (ref 11.5–15.5)
WBC: 8.3 10*3/uL (ref 4.0–10.5)
nRBC: 0 % (ref 0.0–0.2)

## 2021-11-27 LAB — BASIC METABOLIC PANEL
Anion gap: 4 — ABNORMAL LOW (ref 5–15)
BUN: 7 mg/dL — ABNORMAL LOW (ref 8–23)
CO2: 28 mmol/L (ref 22–32)
Calcium: 8.3 mg/dL — ABNORMAL LOW (ref 8.9–10.3)
Chloride: 106 mmol/L (ref 98–111)
Creatinine, Ser: 0.69 mg/dL (ref 0.61–1.24)
GFR, Estimated: >60 mL/min (ref >60)
Glucose, Bld: 150 mg/dL — ABNORMAL HIGH (ref 70–99)
Potassium: 3.8 mmol/L (ref 3.5–5.1)
Sodium: 138 mmol/L (ref 135–145)

## 2021-11-27 MED ORDER — METOPROLOL TARTRATE 50 MG PO TABS
50.0000 mg | ORAL_TABLET | Freq: Two times a day (BID) | ORAL | 1 refills | Status: AC
Start: 2021-11-27 — End: ?

## 2021-11-27 MED ORDER — APIXABAN 5 MG PO TABS: 5.0000 mg | ORAL_TABLET | Freq: Two times a day (BID) | ORAL | 0 refills | Status: AC

## 2021-11-27 MED ORDER — METOPROLOL TARTRATE 50 MG PO TABS
50.0000 mg | ORAL_TABLET | Freq: Two times a day (BID) | ORAL | Status: DC
  Administered 2021-11-27: 50 mg via ORAL
  Filled 2021-11-27: qty 1

## 2021-11-27 NOTE — Progress Notes (Signed)
?   11/27/21 0805  ?Vitals  ?BP 128/72  ?MAP (mmHg) 89  ?BP Location Right Arm  ?BP Method Automatic  ?Patient Position (if appropriate) Lying  ?Pulse Rate (!) 128  ?Pulse Rate Source Monitor  ?Resp (!) 21  ?Level of Consciousness  ?Level of Consciousness Alert  ?MEWS COLOR  ?MEWS Score Color Yellow  ?Orthostatic Lying   ?Pulse- Lying 128  ?BP- Lying 128/72  ?Orthostatic Sitting  ?BP- Sitting  ?(unable to obtain)  ?Pulse- Sitting 178  ?Oxygen Therapy  ?SpO2 98 %  ?O2 Device Room Air  ?Pain Assessment  ?Pain Scale 0-10  ?Pain Score 7  ?Pain Type Acute pain  ?Pain Location Arm  ?Pain Orientation Left  ?Pain Intervention(s) Medication (See eMAR)  ?MEWS Score  ?MEWS Temp 0  ?MEWS Systolic 0  ?MEWS Pulse 2  ?MEWS RR 1  ?MEWS LOC 0  ?MEWS Score 3  ? ?Pt was c/o severe left arm pain, medicated with hydrocodone per order. Dressing to left upper arm skin tear saturated with serosanguinous drainage, bed pad, pt's shirt and bed sheet also saturated. Old dressing removed and new dressing applied, small oozing of serous drainage noted mid wound. ?Pt stated was unable to get any real sleep last pm, now feels very SOB and weak, unable to get OOB to walk to bathroom. AP noted to be 134 and irregular. B/p checked and reading as above. Pt placed on tele monitor to accurately assess heart rate. Pt asked to sit up on side of bed for comfort. ?When pt sat up, HR noted to be 175-180 afib, pt c/o increased SOB and weakness, unable to obtain b/p with pt sitting, pt pale but skin dry. Pt assisted back to lying position, HR down to 128-135. MD Natale Milch notified via [facility] page of current condition. EKG obtained, shows afib-RVR. Pt states feels better lying down but still, "Weak as water. Francesca Oman got to do something for me honey." Repeat B/p 134/77 at this time. Awaiting MD response. ?

## 2021-11-27 NOTE — Progress Notes (Signed)
CSW spoke with Barbara Cower of [facility] to inform him of patient's discharge plan for today. [facility] will follow up with patient after discharge. ? ?Edwin Dada, MSW, LCSW ?Transitions of Care  Clinical Social Worker II ?[phone] ? ?

## 2021-11-27 NOTE — Progress Notes (Signed)
Pt's wife instructed on dressing change for left arm wounds, pt's wife able to correctly complete dressing change to both wounds with only verbal assistance from me. Discharge instructions review with both patient and wife, questions answered to their satisfaction. Pt discharged via WC to POV with belongings in his possession. ?

## 2021-11-27 NOTE — Progress Notes (Signed)
?   11/27/21 0927  ?Vitals  ?BP 112/77  ?MAP (mmHg) 80  ?BP Location Right Arm  ?BP Method Automatic  ?Patient Position (if appropriate) Sitting  ?Pulse Rate 92  ?Pulse Rate Source [place]  ?Resp 18  ?Level of Consciousness  ?Level of Consciousness Alert  ?MEWS COLOR  ?MEWS Score Color Green  ?Oxygen Therapy  ?SpO2 100 %  ?O2 Device Room Air  ?PCA/Epidural/Spinal Assessment  ?Respiratory Pattern Regular;Unlabored  ?ECG Monitoring  ?CV Strip Heart Rate 90  ?Cardiac Rhythm Atrial fibrillation  ?MEWS Score  ?MEWS Temp 0  ?MEWS Systolic 0  ?MEWS Pulse 0  ?MEWS RR 0  ?MEWS LOC 0  ?MEWS Score 0  ? ?Pt has received po metoprolol 50mg  per MD order. Currently sitting up in bed and has eaten breakfast food brought in by wife. Denies any c/o SOB at this time, resp even and nonlabored. HR afib 80-90 bpm, b/p as listed above. Pt states he feels a lot better now than he did this am, wife states he was able to ambulate with her assist to bathroom to void, states was weak but no dizziness or lightheadedness. Left arm remains up on pillow. ?

## 2021-11-27 NOTE — Discharge Summary (Signed)
Physician Discharge Summary  ?Curtis Morris [id number] DOB: 10-Feb-1941 DOA: 11/24/2021 ? ?PCP: [facility] ? ?Admit date: 11/24/2021 ?Discharge date: 11/27/2021 ? ?Admitted From: Home ?Disposition: Home ? ?Recommendations for Outpatient Follow-up:  ?Follow up with PCP in 1-2 weeks ?Please obtain BMP/CBC in one week ?Please follow up with dentist in the next 24 hours as scheduled on the 24th ? ?Home Health: PT ?Equipment/Devices: None ? ?Discharge Condition: Stable ?CODE STATUS: Full ?Diet recommendation: As tolerated ? ?Brief/Interim Summary: ?Curtis Morris is a 81 y.o. male with medical history significant for atrial fibrillation on chronic anticoagulation with Xarelto, non-insulin-dependent diabetes mellitus, hypertension. Patient presented from home after mechanical fall off a ladder with substantially large left upper extremity hematoma, initially concerning for compartment syndrome but evaluated and found to be vascularly intact distally to the hematoma. ? ?Discharge Diagnoses:  ?Principal Problem: ?  Fall ?Active Problems: ?  Atrial fibrillation ([place]) ?  Hematoma of arm, left, initial encounter ?  Diabetes mellitus ([place]) ?  Essential hypertension ? ?Large hematoma of arm, left, initial encounter, without compartment syndrome, POA ?Secondary to mechanical fall likely complicated by orthostatics, acute on recurrent ?-Distal blood flow intact, continue dressing changes per recommendations by wound care ?-Repeat H&H continues to downtrend despite cessation of anticoagulation ?-We will sideline interventional radiology for further plan of care if patient's hemoglobin continues to drop concerning for ongoing bleeding despite discontinuation of anticoagulation ?CTA left upper extremity shows no ongoing active bleeding, appreciate interventional radiology insight/recommendations ?-Continue supportive care, warm compresses, loose bandage, avoid overly tightened bandages or tape ?-PT/OT to follow,  patient markedly unstable on his feet 21st -currently recommending home health PT ?-Head and cervical CT without acute abnormality. ?  ?Profound orthostatic hypotension, resolved ?-Patient evaluated 21st with profound orthostatic hypotension and nausea vomiting and unsteady gait-likely second to profound blood loss ?-Symptoms remarkably well controlled now, hemoglobin stabilizing blood pressure normalizing ?  ?Essential hypertension ?-Continue to hold home metoprolol until patient's vitals recover from previous hypotension ?  ?Diabetes mellitus ([place]), non-insulin-dependent, uncontrolled ?- HgbA1c elevated at 7.8 ?-Continue sliding scale insulin, hypoglycemic protocol, hold home metformin in the interim ?  ?Atrial fibrillation ([place]) ?Rate controlled -hold Eliquis; restart date pending left upper extremity hematoma resolution, likely 1 to 2 weeks in estimation ?- Resume Metoprolol in the next 24-48h pending vitals ?  ?DVT prophylaxis: None given above ?Code Status: Full ?Family Communication: At bedside ? ?Discharge Instructions ? ?Discharge Instructions   ? ? Discharge patient   Complete by: As directed ?  ? Discharge disposition: 01-Home or Self Care  ? Discharge patient date: 11/27/2021  ? Face-to-face encounter (required for Medicare/Medicaid patients)   Complete by: As directed ?  ? I Little Ishikawa certify that this patient is under my care and that I, or a nurse practitioner or physician's assistant working with me, had a face-to-face encounter that meets the physician face-to-face encounter requirements with this patient on 11/27/2021. The encounter with the patient was in whole, or in part for the following medical condition(s) which is the primary reason for home health care (List medical condition): Ambulatory dysfunction  ? The encounter with the patient was in whole, or in part, for the following medical condition, which is the primary reason for home health care: Ambulatory dysfunction  ? I certify  that, based on my findings, the following services are medically necessary home health services: Physical therapy  ? Reason for Medically Necessary Home Health Services: Therapy- Personnel officer, Training and development officer and  Stair Training  ? My clinical findings support the need for the above services: Unable to leave home safely without assistance and/or assistive device  ? Further, I certify that my clinical findings support that this patient is homebound due to: Unable to leave home safely without assistance  ? Home Health   Complete by: As directed ?  ? To provide the following care/treatments: PT  ? ?  ? ?Allergies as of 11/27/2021   ?No Known Allergies ?  ? ?  ?Medication List  ?  ? ?STOP taking these medications   ? ?cyclobenzaprine 10 MG tablet ?Commonly known as: FLEXERIL ?  ?oxyCODONE-acetaminophen 5-325 MG tablet ?Commonly known as: Percocet ?  ?sucralfate 1 g tablet ?Commonly known as: Carafate ?  ? ?  ? ?TAKE these medications   ? ?apixaban 5 MG Tabs tablet ?Commonly known as: ELIQUIS ?Take 1 tablet (5 mg total) by mouth 2 (two) times daily. ?Start taking on: December 02, 2021 ?What changed: These instructions start on December 02, 2021. If you are unsure what to do until then, ask your doctor or other care provider. ?  ?aspirin EC 81 MG tablet ?Take 81 mg by mouth daily. Swallow whole. ?  ?ATORVASTATIN CALCIUM PO ?Take 20 mg by mouth at bedtime. ?  ?CALCIUM-VITAMIN D3 PO ?Take 1 tablet by mouth 2 (two) times daily. ?  ?lipase/protease/amylase 12000-38000 units Cpep capsule ?Commonly known as: CREON ?Take 12,000 Units by mouth 2 (two) times daily. ?  ?magnesium oxide 400 MG tablet ?Commonly known as: MAG-OX ?Take 400 mg by mouth daily. ?  ?metFORMIN 500 MG tablet ?Commonly known as: GLUCOPHAGE ?Take 500 mg by mouth every evening. ?  ?metoprolol tartrate 50 MG tablet ?Commonly known as: LOPRESSOR ?Take 1 tablet (50 mg total) by mouth 2 (two) times daily. ?What changed: medication strength ?  ?ondansetron 4 MG  disintegrating tablet ?Commonly known as: Zofran ODT ?Take 1 tablet (4 mg total) by mouth every 8 (eight) hours as needed for nausea or vomiting. ?  ?pantoprazole 40 MG tablet ?Commonly known as: PROTONIX ?Take 40 mg by mouth 2 (two) times daily. ?  ?tamsulosin 0.4 MG Caps capsule ?Commonly known as: FLOMAX ?Take 0.4 mg by mouth at bedtime. ?  ? ?  ? ? ?No Known Allergies ? ?Consultations: ?Interventional radiology ? ?Procedures/Studies: ?CT Head Wo Contrast ? ?Result Date: 11/24/2021 ?CLINICAL DATA:  Head injury after fall. EXAM: CT HEAD WITHOUT CONTRAST CT CERVICAL SPINE WITHOUT CONTRAST TECHNIQUE: Multidetector CT imaging of the head and cervical spine was performed following the standard protocol without intravenous contrast. Multiplanar CT image reconstructions of the cervical spine were also generated. RADIATION DOSE REDUCTION: This exam was performed according to the departmental dose-optimization program which includes automated exposure control, adjustment of the mA and/or kV according to patient size and/or use of iterative reconstruction technique. COMPARISON:  None available currently. FINDINGS: CT HEAD FINDINGS Brain: Mild chronic ischemic white matter disease is noted. No mass effect or midline shift is noted. Ventricular size is within normal limits. There is no evidence of mass lesion, hemorrhage or acute infarction. Vascular: No hyperdense vessel or unexpected calcification. Skull: Normal. Negative for fracture or focal lesion. Sinuses/Orbits: Left frontal and ethmoid sinusitis is noted. Other: None. CT CERVICAL SPINE FINDINGS Alignment: Normal. Skull base and vertebrae: No acute fracture. No primary bone lesion or focal pathologic process. Soft tissues and spinal canal: No prevertebral fluid or swelling. No visible canal hematoma. Disc levels: Mild degenerative disc disease is noted at C4-5. Severe  degenerative disc disease is noted at C6-7. Upper chest: Negative. Other: None. IMPRESSION: No acute  intracranial abnormality seen. 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 No acute abnormality is noted. Electronically Signed   By: Marijo Conception M.D.   On: 11/24/2021 16:46  ? ?CT [facility]

## 2021-11-30 ENCOUNTER — Ambulatory Visit: Payer: Self-pay

## 2021-11-30 NOTE — Telephone Encounter (Signed)
Patient wife called in stated that patient was just released from the hospital and she is concerned since his blood sugar keep going up to 286 is the highest and he is a bit more tired than usual. She is concerned asked to speak to a nurse can be reached at Ph# [phone]  ? ? ? ?Chief Complaint: BS running 286. Called PCP, told "not to worry." ?Symptoms: Some weakness ?Frequency: Started this week. ?Pertinent Negatives: Patient denies any other symptoms. ?Disposition: [] ED /[] Urgent Care (no appt availability in office) / [] Appointment(In office/virtual)/ []  [facility]/ [x] Home Care/ [] Refused Recommended Disposition /[]  Mobile Bus/ []  Follow-up with PCP ?Additional Notes: Will call PCP for worsening of symptoms.  ?Reason for Disposition ? [1] Blood glucose 240 - 300 mg/dL ( - mmol/L) AND [2] does not  use insulin (e.g., not insulin-dependent; most people with type 2 diabetes) ? ?Answer Assessment - Initial Assessment Questions ?1. SYMPTOMS: "What symptoms are you concerned about?" ?    Feels tired ?2. ONSET:  "When did the symptoms start?" ?    This week ?3. BLOOD GLUCOSE: "What is your blood glucose level?"  ?    286 ?4. USUAL RANGE: "What is your blood glucose level usually?" (e.g., usual fasting morning value, usual evening value) ?    200 ?5. TYPE 1 or 2:  "Do you know what type of diabetes you have?"  (e.g., Type 1, Type 2, Gestational; doesn't know)  ?    Type 2  ?6. INSULIN: "Do you take insulin?" "What type of insulin(s) do you use? What is the mode of delivery? (syringe, pen; injection or pump) "When did you last give yourself an insulin dose?" (i.e., time or hours/minutes ago) "How much did you give?" (i.e., how many units) ?    N/A ?7. DIABETES PILLS: "Do you take any pills for your diabetes?" ?    Yes ?8. OTHER SYMPTOMS: "Do you have any symptoms?" (e.g., fever, frequent urination, difficulty breathing, vomiting) ?    No ?9. LOW BLOOD GLUCOSE TREATMENT: "What have  you done so far to treat the low blood glucose level?" ?    N/A ?10. FOOD: "When did you last eat or drink?" ?      Today ?11. ALONE: "Are you alone right now or is someone with you?"  ?      No, wife ?12. PREGNANCY: "Is there any chance you are pregnant?" "When was your last menstrual period?" ?      N/a ? ?Answer Assessment - Initial Assessment Questions ?1. BLOOD GLUCOSE: "What is your blood glucose level?"  ?    286 ?2. ONSET: "When did you check the blood glucose?" ?    This week ?3. USUAL RANGE: "What is your glucose level usually?" (e.g., usual fasting morning value, usual evening value) ?    200 ?4. KETONES: "Do you check for ketones (urine or blood test strips)?" If yes, ask: "What does the test show now?"  ?    No ?5. TYPE 1 or 2:  "Do you know what type of diabetes you have?"  (e.g., Type 1, Type 2, Gestational; doesn't know)  ?    Type 2  ?6. INSULIN: "Do you take insulin?" "What type of insulin(s) do you use? What is the mode of delivery? (syringe, pen; injection or pump)?"  ?    No ?7. DIABETES PILLS: "Do you take any pills for your diabetes?" If yes, ask: "Have you missed taking any pills recently?" ?  Yes ?8. OTHER SYMPTOMS: "Do you have any symptoms?" (e.g., fever, frequent urination, difficulty breathing, dizziness, weakness, vomiting) ?    Weakness ?9. PREGNANCY: "Is there any chance you are pregnant?" "When was your last menstrual period?" ?    N/A ? ?Protocols used: Diabetes - Low Blood Sugar-A-AH, Diabetes - High Blood Sugar-A-AH ? ?

## 2021-12-06 ENCOUNTER — Encounter (HOSPITAL_COMMUNITY): Payer: Self-pay | Admitting: Emergency Medicine

## 2021-12-06 ENCOUNTER — Emergency Department (HOSPITAL_COMMUNITY)
Admission: EM | Admit: 2021-12-06 | Discharge: 2021-12-06 | Disposition: A | Discharge status: Home / Self Care | Payer: No Typology Code available for payment source | Attending: Emergency Medicine | Admitting: Emergency Medicine

## 2021-12-06 ENCOUNTER — Emergency Department (HOSPITAL_COMMUNITY): Payer: No Typology Code available for payment source

## 2021-12-06 ENCOUNTER — Other Ambulatory Visit: Payer: Self-pay

## 2021-12-06 DIAGNOSIS — I10 Essential (primary) hypertension: Secondary | ICD-10-CM | POA: Diagnosis not present

## 2021-12-06 DIAGNOSIS — E119 Type 2 diabetes mellitus without complications: Secondary | ICD-10-CM | POA: Diagnosis not present

## 2021-12-06 DIAGNOSIS — Z7901 Long term (current) use of anticoagulants: Secondary | ICD-10-CM | POA: Diagnosis not present

## 2021-12-06 DIAGNOSIS — Z7984 Long term (current) use of oral hypoglycemic drugs: Secondary | ICD-10-CM | POA: Diagnosis not present

## 2021-12-06 DIAGNOSIS — K5900 Constipation, unspecified: Secondary | ICD-10-CM | POA: Diagnosis not present

## 2021-12-06 DIAGNOSIS — Z7982 Long term (current) use of aspirin: Secondary | ICD-10-CM | POA: Diagnosis not present

## 2021-12-06 DIAGNOSIS — K861 Other chronic pancreatitis: Secondary | ICD-10-CM | POA: Insufficient documentation

## 2021-12-06 DIAGNOSIS — R109 Unspecified abdominal pain: Secondary | ICD-10-CM | POA: Diagnosis present

## 2021-12-06 LAB — COMPREHENSIVE METABOLIC PANEL
ALT: 12 U/L (ref 0–44)
AST: 14 U/L — ABNORMAL LOW (ref 15–41)
Albumin: 3.8 g/dL (ref 3.5–5.0)
Alkaline Phosphatase: 69 U/L (ref 38–126)
Anion gap: 9 (ref 5–15)
BUN: 18 mg/dL (ref 8–23)
CO2: 27 mmol/L (ref 22–32)
Calcium: 9.4 mg/dL (ref 8.9–10.3)
Chloride: 100 mmol/L (ref 98–111)
Creatinine, Ser: 1.06 mg/dL (ref 0.61–1.24)
GFR, Estimated: >60 mL/min (ref >60)
Glucose, Bld: 138 mg/dL — ABNORMAL HIGH (ref 70–99)
Potassium: 4.5 mmol/L (ref 3.5–5.1)
Sodium: 136 mmol/L (ref 135–145)
Total Bilirubin: 1 mg/dL (ref 0.3–1.2)
Total Protein: 7.7 g/dL (ref 6.5–8.1)

## 2021-12-06 LAB — URINALYSIS, ROUTINE W REFLEX MICROSCOPIC
Bilirubin Urine: NEGATIVE
Glucose, UA: NEGATIVE mg/dL
Hgb urine dipstick: NEGATIVE
Ketones, ur: 20 mg/dL — AB
Leukocytes,Ua: NEGATIVE
Nitrite: NEGATIVE
Protein, ur: NEGATIVE mg/dL
Specific Gravity, Urine: 1.031 — ABNORMAL HIGH (ref 1.005–1.030)
pH: 5 (ref 5.0–8.0)

## 2021-12-06 LAB — CBC
HCT: 33.9 % — ABNORMAL LOW (ref 39.0–52.0)
Hemoglobin: 11 g/dL — ABNORMAL LOW (ref 13.0–17.0)
MCH: 31.8 pg (ref 26.0–34.0)
MCHC: 32.4 g/dL (ref 30.0–36.0)
MCV: 98 fL (ref 80.0–100.0)
Platelets: 607 10*3/uL — ABNORMAL HIGH (ref 150–400)
RBC: 3.46 MIL/uL — ABNORMAL LOW (ref 4.22–5.81)
RDW: 14.3 % (ref 11.5–15.5)
WBC: 8.2 10*3/uL (ref 4.0–10.5)
nRBC: 0 % (ref 0.0–0.2)

## 2021-12-06 LAB — LIPASE, BLOOD: Lipase: 19 U/L (ref 11–51)

## 2021-12-06 MED ORDER — ONDANSETRON HCL 4 MG/2ML IJ SOLN
4.0000 mg | Freq: Once | INTRAMUSCULAR | Status: AC
  Administered 2021-12-06: 4 mg via INTRAVENOUS
  Filled 2021-12-06: qty 2

## 2021-12-06 MED ORDER — IOHEXOL 300 MG/ML  SOLN
100.0000 mL | Freq: Once | INTRAMUSCULAR | Status: AC | PRN
  Administered 2021-12-06: 100 mL via INTRAVENOUS

## 2021-12-06 MED ORDER — ONDANSETRON HCL 8 MG PO TABS: 8.0000 mg | ORAL_TABLET | Freq: Three times a day (TID) | ORAL | 0 refills | Status: DC | PRN

## 2021-12-06 MED ORDER — HYDROMORPHONE HCL 1 MG/ML IJ SOLN
0.5000 mg | Freq: Once | INTRAMUSCULAR | Status: AC
  Administered 2021-12-06: 0.5 mg via INTRAVENOUS
  Filled 2021-12-06: qty 0.5

## 2021-12-06 MED ORDER — HYDROMORPHONE HCL 1 MG/ML IJ SOLN
1.0000 mg | Freq: Once | INTRAMUSCULAR | Status: AC
  Administered 2021-12-06: 1 mg via INTRAVENOUS
  Filled 2021-12-06: qty 1

## 2021-12-06 MED ORDER — OXYCODONE-ACETAMINOPHEN 5-325 MG PO TABS: 1.0000 | ORAL_TABLET | Freq: Four times a day (QID) | ORAL | 0 refills | Status: AC | PRN

## 2021-12-06 NOTE — Discharge Instructions (Addendum)
Come back to ED with any new symptoms such as inability to pass stool, fevers, increased abdominal pain ?Please follow up with your PCP for any ongoing needs ?Please purchase miralax as well as Metamucil fiber for constipation ?Please be cautious while taking pain medications. Do not drive or operate heavy machinery while under influence of this medication ?Please pick up zofran for nausea as well as pain medication I have prescribed ?Please read attached information guide concerning pancreatitis ?

## 2021-12-06 NOTE — ED Triage Notes (Signed)
Pt having abdominal pain with yellow vomiting starting this afternoon. ?

## 2021-12-06 NOTE — ED Provider Notes (Signed)
?[facility] ?Provider Note ? ? ?CSN: [id number] ?Arrival date & time: 12/06/21  1425 ? ?  ? ?History ? ?Chief Complaint  ?Patient presents with  ? Abdominal Pain  ? ? ?Curtis Morris is a 81 y.o. male with history of A-fib currently holding anticoagulation at PCP request, diabetes, hypertension, chronic pancreatitis.  Patient presents ED for evaluation of abdominal pain.  Patient present with wife.  Patient states that for the last 13 days, since being discharged from the [facility] in [place], he has had increased pain and "hematoma" to an area on his left bicep.  The patient reports that the day prior to today, he was evaluated at the [facility] in [place] and had a CT scan of the area in question on his arm which did not show any concerning signs of infection.  The patient was advised that this hematoma will resorb on its own over time.  Patient presents today however complaining of abdominal pain, nausea due to the pain from this site on his arm.  The patient states that he has not had a bowel movement in 13 days, has history of cholecystectomy as well as appendectomy.  Patient states that he had 1 episode of abdominal pain this morning that he believes is related to his pancreatitis however presently he does not have any abdominal pain.  The patient is endorsing nausea, vomiting, constipation, abdominal pain.  Patient denies any fevers, body aches or chills. ? ? ?Abdominal Pain ?Associated symptoms: constipation, nausea and vomiting   ? ?  ? ?Home Medications ?Prior to Admission medications   ?Medication Sig Start Date End Date Taking? Authorizing Provider  ?ondansetron (ZOFRAN) 8 MG tablet Take 1 tablet (8 mg total) by mouth every 8 (eight) hours as needed for nausea or vomiting. 12/06/21  Yes Azucena Cecil, PA-C  ?oxyCODONE-acetaminophen (PERCOCET/ROXICET) 5-325 MG tablet Take 1 tablet by mouth every 6 (six) hours as needed for severe pain. 12/06/21  Yes Azucena Cecil, PA-C  ?apixaban (ELIQUIS) 5  MG TABS tablet Take 1 tablet (5 mg total) by mouth 2 (two) times daily. 12/02/21   Little Ishikawa, MD  ?aspirin EC 81 MG tablet Take 81 mg by mouth daily. Swallow whole.    [provider]  ?ATORVASTATIN CALCIUM PO Take 20 mg by mouth at bedtime.    [provider]  ?Calcium Carbonate-Vitamin D (CALCIUM-VITAMIN D3 PO) Take 1 tablet by mouth 2 (two) times daily.    [provider]  ?lipase/protease/amylase (CREON) 12000 units CPEP capsule Take 12,000 Units by mouth 2 (two) times daily.    [provider]  ?magnesium oxide (MAG-OX) 400 MG tablet Take 400 mg by mouth daily.    [provider]  ?metFORMIN (GLUCOPHAGE) 500 MG tablet Take 500 mg by mouth every evening.    [provider]  ?metoprolol tartrate (LOPRESSOR) 50 MG tablet Take 1 tablet (50 mg total) by mouth 2 (two) times daily. 11/27/21   Little Ishikawa, MD  ?ondansetron (ZOFRAN ODT) 4 MG disintegrating tablet Take 1 tablet (4 mg total) by mouth every 8 (eight) hours as needed for nausea or vomiting. ?Patient not taking: Reported on 11/24/2021 08/04/18   Little, Wenda Overland, MD  ?pantoprazole (PROTONIX) 40 MG tablet Take 40 mg by mouth 2 (two) times daily.    [provider]  ?tamsulosin (FLOMAX) 0.4 MG CAPS capsule Take 0.4 mg by mouth at bedtime.    [provider]  ?   ? ?Allergies    ?  Patient has no known allergies.   ? ?Review of Systems   ?Review of Systems  ?Gastrointestinal:  Positive for abdominal pain, constipation, nausea and vomiting.  ?All other systems reviewed and are negative. ? ?Physical Exam ?Updated Vital Signs ?BP (!) 149/93   Pulse (!) 102   Temp 98.2 ?F (36.8 ?C) (Oral)   Resp (!) 27   Ht 5\' 11"  (1.803 m)   Wt 59.9 kg   SpO2 (!) 82%   BMI 18.41 kg/m?  ?Physical Exam ?Vitals and nursing note reviewed.  ?Constitutional:   ?   General: He is not in acute distress. ?   Appearance: He is well-developed. He is not ill-appearing, toxic-appearing or  diaphoretic.  ?HENT:  ?   Head: Normocephalic and atraumatic.  ?   Mouth/Throat:  ?   Mouth: Mucous membranes are moist.  ?   Pharynx: Oropharynx is clear.  ?Eyes:  ?   Extraocular Movements: Extraocular movements intact.  ?   Pupils: Pupils are equal, round, and reactive to light.  ?Cardiovascular:  ?   Rate and Rhythm: Normal rate and regular rhythm.  ?Pulmonary:  ?   Effort: Pulmonary effort is normal. No respiratory distress.  ?   Breath sounds: Normal breath sounds.  ?Abdominal:  ?   General: Abdomen is flat. Bowel sounds are normal. There is no distension.  ?   Palpations: Abdomen is rigid.  ?   Tenderness: There is no abdominal tenderness.  ?Musculoskeletal:  ?   Cervical back: Normal range of motion and neck supple.  ?Skin: ?   General: Skin is warm and dry.  ?   Capillary Refill: Capillary refill takes less than 2 seconds.  ?   Comments: Morene Antu sign on patient left flank.  Patient left bicep wrapped with gauze, no strikethrough.  The dressing is clean dry and intact.  ?Neurological:  ?   Mental Status: He is alert and oriented to person, place, and time.  ? ? ?ED Results / Procedures / Treatments   ?Labs ?(all labs ordered are listed, but only abnormal results are displayed) ?Labs Reviewed  ?COMPREHENSIVE METABOLIC PANEL - Abnormal; Notable for the following components:  ?    Result Value  ? Glucose, Bld 138 (*)   ? AST 14 (*)   ? All other components within normal limits  ?CBC - Abnormal; Notable for the following components:  ? RBC 3.46 (*)   ? Hemoglobin 11.0 (*)   ? HCT 33.9 (*)   ? Platelets 607 (*)   ? All other components within normal limits  ?URINALYSIS, ROUTINE W REFLEX MICROSCOPIC - Abnormal; Notable for the following components:  ? Color, Urine AMBER (*)   ? Specific Gravity, Urine 1.031 (*)   ? Ketones, ur 20 (*)   ? All other components within normal limits  ?LIPASE, BLOOD  ? ? ?EKG ?None ? ?Radiology ?CT ABDOMEN PELVIS W CONTRAST ? ?Result Date: 12/06/2021 ?CLINICAL DATA:  Abdominal pain  with nausea and vomiting. EXAM: CT ABDOMEN AND PELVIS WITH CONTRAST TECHNIQUE: Multidetector CT imaging of the abdomen and pelvis was performed using the standard protocol following bolus administration of intravenous contrast. RADIATION DOSE REDUCTION: This exam was performed according to the departmental dose-optimization program which includes automated exposure control, adjustment of the mA and/or kV according to patient size and/or use of iterative reconstruction technique. CONTRAST:  17mL OMNIPAQUE IOHEXOL 300 MG/ML  SOLN COMPARISON:  August 04, 2018 FINDINGS: Lower chest: Stable cardiomegaly is seen. No acute abnormalities are  identified. Hepatobiliary: No focal liver abnormality is seen. Status post cholecystectomy. No biliary dilatation. Pancreas: Numerous parenchymal calcifications are seen within the pancreatic body and tail. No pancreatic ductal dilatation or surrounding inflammatory changes. Spleen: The spleen is surgically absent. Adrenals/Urinary Tract: Adrenal glands are unremarkable. Kidneys are normal in size, without obstructing renal calculi or hydronephrosis. 10 mm and 6 mm cysts are seen within the right kidney. No additional follow-up for imaging is recommended. Mild cortical scarring is seen along the posterior aspect of the left kidney. A 2 mm renal calculus is seen within the right kidney. Contrast is seen throughout the lumen of a poorly distended urinary bladder. Stomach/Bowel: There is a small hiatal hernia. Metallic density surgical clips are seen surrounding the gastroesophageal junction. The appendix is surgically absent. Stool is seen throughout the large bowel. No evidence of bowel dilatation. Noninflamed diverticula are seen within the sigmoid colon. An area of mild mesenteric twisting is seen along the midline of the lower abdomen and pelvis (axial CT images 44 through 62, CT series 2). This is not seen on the prior study. Vascular/Lymphatic: Aortic atherosclerosis. No enlarged  abdominal or pelvic lymph nodes. Reproductive: The prostate gland is moderately enlarged and heterogeneous in appearance. Prostate gland calcification is also seen. Other: No abdominal wall hernia or abnormality. N

## 2022-05-01 ENCOUNTER — Emergency Department (HOSPITAL_COMMUNITY): Payer: No Typology Code available for payment source

## 2022-05-01 ENCOUNTER — Emergency Department (HOSPITAL_COMMUNITY)
Admission: EM | Admit: 2022-05-01 | Discharge: 2022-05-01 | Disposition: A | Discharge status: Home / Self Care | Payer: No Typology Code available for payment source | Attending: Emergency Medicine | Admitting: Emergency Medicine

## 2022-05-01 ENCOUNTER — Encounter (HOSPITAL_COMMUNITY): Payer: Self-pay

## 2022-05-01 ENCOUNTER — Other Ambulatory Visit: Payer: Self-pay

## 2022-05-01 DIAGNOSIS — I509 Heart failure, unspecified: Secondary | ICD-10-CM | POA: Diagnosis not present

## 2022-05-01 DIAGNOSIS — R519 Headache, unspecified: Secondary | ICD-10-CM | POA: Diagnosis present

## 2022-05-01 DIAGNOSIS — E119 Type 2 diabetes mellitus without complications: Secondary | ICD-10-CM | POA: Diagnosis not present

## 2022-05-01 DIAGNOSIS — J329 Chronic sinusitis, unspecified: Secondary | ICD-10-CM

## 2022-05-01 DIAGNOSIS — Z87891 Personal history of nicotine dependence: Secondary | ICD-10-CM | POA: Diagnosis not present

## 2022-05-01 DIAGNOSIS — I110 Hypertensive heart disease with heart failure: Secondary | ICD-10-CM | POA: Insufficient documentation

## 2022-05-01 LAB — CBC
HCT: 33.3 % — ABNORMAL LOW (ref 39.0–52.0)
Hemoglobin: 10.6 g/dL — ABNORMAL LOW (ref 13.0–17.0)
MCH: 29.3 pg (ref 26.0–34.0)
MCHC: 31.8 g/dL (ref 30.0–36.0)
MCV: 92 fL (ref 80.0–100.0)
Platelets: 342 10*3/uL (ref 150–400)
RBC: 3.62 MIL/uL — ABNORMAL LOW (ref 4.22–5.81)
RDW: 16.1 % — ABNORMAL HIGH (ref 11.5–15.5)
WBC: 6.5 10*3/uL (ref 4.0–10.5)
nRBC: 0 % (ref 0.0–0.2)

## 2022-05-01 LAB — BASIC METABOLIC PANEL
Anion gap: 4 — ABNORMAL LOW (ref 5–15)
BUN: 12 mg/dL (ref 8–23)
CO2: 29 mmol/L (ref 22–32)
Calcium: 9 mg/dL (ref 8.9–10.3)
Chloride: 106 mmol/L (ref 98–111)
Creatinine, Ser: 0.82 mg/dL (ref 0.61–1.24)
GFR, Estimated: >60 mL/min (ref >60)
Glucose, Bld: 144 mg/dL — ABNORMAL HIGH (ref 70–99)
Potassium: 4.1 mmol/L (ref 3.5–5.1)
Sodium: 139 mmol/L (ref 135–145)

## 2022-05-01 LAB — TROPONIN I (HIGH SENSITIVITY)
Troponin I (High Sensitivity): 6 ng/L (ref <18)
Troponin I (High Sensitivity): 6 ng/L (ref <18)

## 2022-05-01 LAB — BRAIN NATRIURETIC PEPTIDE: B Natriuretic Peptide: 455 pg/mL — ABNORMAL HIGH (ref 0.0–100.0)

## 2022-05-01 MED ORDER — FUROSEMIDE 20 MG PO TABS: 20.0000 mg | ORAL_TABLET | Freq: Every day | ORAL | 0 refills | Status: AC

## 2022-05-01 MED ORDER — AMOXICILLIN-POT CLAVULANATE 875-125 MG PO TABS: 1.0000 | ORAL_TABLET | Freq: Two times a day (BID) | ORAL | 0 refills | Status: AC

## 2022-05-01 MED ORDER — ONDANSETRON 4 MG PO TBDP: 4.0000 mg | ORAL_TABLET | Freq: Three times a day (TID) | ORAL | 0 refills | Status: DC | PRN

## 2022-05-01 NOTE — ED Triage Notes (Signed)
Pt arrived via POV c/o hypertension at home with nausea w/o emesis. Pt endorses intermittent CP with Hx of A-Fib.

## 2022-05-01 NOTE — ED Notes (Signed)
Patient verbalizes understanding of discharge instructions. Opportunity for questioning and answers were provided. Armband removed by staff, pt discharged from ED. Ambulated out to lobby with wife  

## 2022-05-01 NOTE — ED Provider Notes (Signed)
[facility] Emergency Department Provider Note MRN:  [id number]  Arrival date & time: 05/01/22     Chief Complaint   Hypertension   History of Present Illness   Curtis Morris is a 81 y.o. year-old male with a history of hypertension, diabetes, A-fib presenting to the ED with chief complaint of hypertension.  Check blood pressure and it was high this evening, 170s.  Having some headache today, nausea, short of breath when laying flat.  No chest pain, no abdominal pain, no numbness or weakness to the arms or legs.  Review of Systems  A thorough review of systems was obtained and all systems are negative except as noted in the HPI and PMH.   Patient's Health History    Past Medical History:  Diagnosis Date   A-fib ([place])    Diabetes mellitus without complication ([place])    High cholesterol    Hypertension     Past Surgical History:  Procedure Laterality Date   ABDOMINAL SURGERY     APPENDECTOMY     SPLENECTOMY, TOTAL      History reviewed. No pertinent family history.  Social History   Socioeconomic History   Marital status: Married    Spouse name: Not on file   Number of children: Not on file   Years of education: Not on file   Highest education level: Not on file  Occupational History   Not on file  Tobacco Use   Smoking status: Former    Types: Cigarettes   Smokeless tobacco: Never  Vaping Use   Vaping Use: Never used  Substance and Sexual Activity   Alcohol use: No   Drug use: No   Sexual activity: Yes  Other Topics Concern   Not on file  Social History Narrative   Not on file   Social Determinants of Health   Financial Resource Strain: Not on file  Food Insecurity: Not on file  Transportation Needs: Not on file  Physical Activity: Not on file  Stress: Not on file  Social Connections: Not on file  Intimate Partner Violence: Not on file     Physical Exam   Vitals:   05/01/22 0500 05/01/22 0530  BP: (!) 140/85 (!) 140/94   Pulse: 66 64  Resp: (!) 24 17  Temp:    SpO2: 97% 98%    CONSTITUTIONAL: Chronically ill-appearing, NAD NEURO/PSYCH:  Alert and oriented x 3, normal and symmetric strength sensation, normal coordination, normal speech EYES:  eyes equal and reactive ENT/NECK:  no LAD, no JVD CARDIO: Regular rate, well-perfused, normal S1 and S2 PULM:  CTAB no wheezing or rhonchi GI/GU:  non-distended, non-tender MSK/SPINE:  No gross deformities, no edema SKIN:  no rash, atraumatic   *Additional and/or pertinent findings included in MDM below  Diagnostic and Interventional Summary    EKG Interpretation  Date/Time:  Monday May 01 2022 03:36:30 EDT Ventricular Rate:  82 PR Interval:    QRS Duration: 107 QT Interval:  414 QTC Calculation: 484 R Axis:   0 Text Interpretation: Atrial fibrillation Anteroseptal infarct, age indeterminate Confirmed by Gerlene Fee [phone]) on 05/01/2022 5:47:41 AM       Labs Reviewed  CBC - Abnormal; Notable for the following components:      Result Value   RBC 3.62 (*)    Hemoglobin 10.6 (*)    HCT 33.3 (*)    RDW 16.1 (*)    All other components within normal limits  BASIC METABOLIC PANEL - Abnormal; Notable  for the following components:   Glucose, Bld 144 (*)    Anion gap 4 (*)    All other components within normal limits  BRAIN NATRIURETIC PEPTIDE - Abnormal; Notable for the following components:   B Natriuretic Peptide 455.0 (*)    All other components within normal limits  TROPONIN I (HIGH SENSITIVITY)  TROPONIN I (HIGH SENSITIVITY)    CT HEAD WO CONTRAST ( )  Final Result    DG Chest Port 1 View  Final Result      Medications - No data to display   Procedures  /  Critical Care Procedures  ED Course and Medical Decision Making  Initial Impression and Ddx Mild symptoms in the setting of hypertension.  Considering intracranial bleeding, ACS, CHF exacerbation, awaiting labs, x-ray, troponin.  Past medical/surgical history that  increases complexity of ED encounter: History of A-fib  Interpretation of Diagnostics I personally reviewed the EKG and my interpretation is as follows: A-fib, no concerning ischemic features  Labs reassuring with no significant blood count or electrolyte disturbance.  BNP is elevated, chest x-ray demonstrating some pulmonary edema.  CT head showing possible sinusitis due to a polyp.  Patient Reassessment and Ultimate Disposition/Management     Findings discussed with patient who has reassuring vital signs, blood pressure, well-appearing, no significant complaints at this time.  No indication for further testing or admission, he is appropriate for discharge with close follow-up.  Patient management required discussion with the following services or consulting groups:  None  Complexity of Problems Addressed Acute illness or injury that poses threat of life of bodily function  Additional Data Reviewed and Analyzed Further history obtained from: Further history from spouse/family member  Additional Factors Impacting ED Encounter Risk Prescriptions  Elmer Sow. Pilar Plate, MD [facility] Emergency Medicine [facility] [email or web address]  Final Clinical Impressions(s) / ED Diagnoses     ICD-10-CM   1. Chronic sinusitis, unspecified location  J32.9     2. Congestive heart failure, unspecified HF chronicity, unspecified heart failure type (HCC)  I50.9       ED Discharge Orders          Ordered    furosemide (LASIX) 20 MG tablet  Daily        05/01/22 0548    amoxicillin-clavulanate (AUGMENTIN) 875-125 MG tablet  Every 12 hours        05/01/22 0548    ondansetron (ZOFRAN-ODT) 4 MG disintegrating tablet  Every 8 hours PRN        05/01/22 0548             Discharge Instructions Discussed with and Provided to Patient:     Discharge Instructions      You were evaluated in the Emergency Department and after careful evaluation, we did not find any emergent  condition requiring admission or further testing in the hospital.  Your exam/testing today was overall reassuring.  CT scan is showing that you have possible sinus infection due to a polyp or mass in the nasal passages.  Important that you follow-up with your ENT to discuss these findings.  Until then take the Augmentin antibiotic to try to treat the sinus infection.  Your labs and chest x-ray show that you have some fluid on the lungs, possibly due to heart failure.  Take the Lasix fluid pill daily.  Important that you follow-up with your primary care doctor within the next few days to discuss this new possible diagnosis.  Please return to the Emergency Department  if you experience any worsening of your condition.  Thank you for allowing [place] to be a part of your care.        Sabas Sous, MD 05/01/22 [phone]

## 2022-05-01 NOTE — Discharge Instructions (Signed)
You were evaluated in the Emergency Department and after careful evaluation, we did not find any emergent condition requiring admission or further testing in the hospital.  Your exam/testing today was overall reassuring.  CT scan is showing that you have possible sinus infection due to a polyp or mass in the nasal passages.  Important that you follow-up with your ENT to discuss these findings.  Until then take the Augmentin antibiotic to try to treat the sinus infection.  Your labs and chest x-ray show that you have some fluid on the lungs, possibly due to heart failure.  Take the Lasix fluid pill daily.  Important that you follow-up with your primary care doctor within the next few days to discuss this new possible diagnosis.  Please return to the Emergency Department if you experience any worsening of your condition.  Thank you for allowing [place] to be a part of your care.

## 2022-08-01 ENCOUNTER — Other Ambulatory Visit: Payer: Self-pay

## 2022-08-01 ENCOUNTER — Encounter (HOSPITAL_COMMUNITY): Payer: Self-pay | Admitting: *Deleted

## 2022-08-01 ENCOUNTER — Emergency Department (HOSPITAL_COMMUNITY): Payer: No Typology Code available for payment source

## 2022-08-01 ENCOUNTER — Emergency Department (HOSPITAL_COMMUNITY)
Admission: EM | Admit: 2022-08-01 | Discharge: 2022-08-01 | Disposition: A | Discharge status: Home / Self Care | Payer: No Typology Code available for payment source | Attending: Emergency Medicine | Admitting: Emergency Medicine

## 2022-08-01 DIAGNOSIS — R531 Weakness: Secondary | ICD-10-CM | POA: Diagnosis present

## 2022-08-01 DIAGNOSIS — Z7984 Long term (current) use of oral hypoglycemic drugs: Secondary | ICD-10-CM | POA: Insufficient documentation

## 2022-08-01 DIAGNOSIS — Z79899 Other long term (current) drug therapy: Secondary | ICD-10-CM | POA: Diagnosis not present

## 2022-08-01 DIAGNOSIS — R109 Unspecified abdominal pain: Secondary | ICD-10-CM | POA: Diagnosis not present

## 2022-08-01 DIAGNOSIS — Z7982 Long term (current) use of aspirin: Secondary | ICD-10-CM | POA: Insufficient documentation

## 2022-08-01 DIAGNOSIS — I10 Essential (primary) hypertension: Secondary | ICD-10-CM | POA: Insufficient documentation

## 2022-08-01 DIAGNOSIS — E119 Type 2 diabetes mellitus without complications: Secondary | ICD-10-CM | POA: Diagnosis not present

## 2022-08-01 DIAGNOSIS — E860 Dehydration: Secondary | ICD-10-CM | POA: Diagnosis not present

## 2022-08-01 DIAGNOSIS — Z7901 Long term (current) use of anticoagulants: Secondary | ICD-10-CM | POA: Diagnosis not present

## 2022-08-01 DIAGNOSIS — R110 Nausea: Secondary | ICD-10-CM

## 2022-08-01 LAB — CBC WITH DIFFERENTIAL/PLATELET
Abs Immature Granulocytes: 0.01 10*3/uL (ref 0.00–0.07)
Basophils Absolute: 0.1 10*3/uL (ref 0.0–0.1)
Basophils Relative: 1 %
Eosinophils Absolute: 0.1 10*3/uL (ref 0.0–0.5)
Eosinophils Relative: 2 %
HCT: 36.1 % — ABNORMAL LOW (ref 39.0–52.0)
Hemoglobin: 11.3 g/dL — ABNORMAL LOW (ref 13.0–17.0)
Immature Granulocytes: 0 %
Lymphocytes Relative: 27 %
Lymphs Abs: 1.6 10*3/uL (ref 0.7–4.0)
MCH: 29.7 pg (ref 26.0–34.0)
MCHC: 31.3 g/dL (ref 30.0–36.0)
MCV: 95 fL (ref 80.0–100.0)
Monocytes Absolute: 0.6 10*3/uL (ref 0.1–1.0)
Monocytes Relative: 10 %
Neutro Abs: 3.4 10*3/uL (ref 1.7–7.7)
Neutrophils Relative %: 60 %
Platelets: 342 10*3/uL (ref 150–400)
RBC: 3.8 MIL/uL — ABNORMAL LOW (ref 4.22–5.81)
RDW: 16.7 % — ABNORMAL HIGH (ref 11.5–15.5)
WBC: 5.7 10*3/uL (ref 4.0–10.5)
nRBC: 0 % (ref 0.0–0.2)

## 2022-08-01 LAB — URINALYSIS, ROUTINE W REFLEX MICROSCOPIC
Bilirubin Urine: NEGATIVE
Glucose, UA: NEGATIVE mg/dL
Hgb urine dipstick: NEGATIVE
Ketones, ur: NEGATIVE mg/dL
Leukocytes,Ua: NEGATIVE
Nitrite: NEGATIVE
Protein, ur: NEGATIVE mg/dL
Specific Gravity, Urine: 1.016 (ref 1.005–1.030)
pH: 7 (ref 5.0–8.0)

## 2022-08-01 LAB — COMPREHENSIVE METABOLIC PANEL
ALT: 27 U/L (ref 0–44)
AST: 25 U/L (ref 15–41)
Albumin: 3.9 g/dL (ref 3.5–5.0)
Alkaline Phosphatase: 60 U/L (ref 38–126)
Anion gap: 6 (ref 5–15)
BUN: 14 mg/dL (ref 8–23)
CO2: 30 mmol/L (ref 22–32)
Calcium: 9.1 mg/dL (ref 8.9–10.3)
Chloride: 103 mmol/L (ref 98–111)
Creatinine, Ser: 0.96 mg/dL (ref 0.61–1.24)
GFR, Estimated: >60 mL/min (ref >60)
Glucose, Bld: 108 mg/dL — ABNORMAL HIGH (ref 70–99)
Potassium: 5 mmol/L (ref 3.5–5.1)
Sodium: 139 mmol/L (ref 135–145)
Total Bilirubin: 0.9 mg/dL (ref 0.3–1.2)
Total Protein: 7.4 g/dL (ref 6.5–8.1)

## 2022-08-01 LAB — LIPASE, BLOOD: Lipase: 24 U/L (ref 11–51)

## 2022-08-01 MED ORDER — FAMOTIDINE 20 MG PO TABS: 20.0000 mg | ORAL_TABLET | Freq: Every day | ORAL | 0 refills | Status: AC

## 2022-08-01 MED ORDER — ONDANSETRON 4 MG PO TBDP: 4.0000 mg | ORAL_TABLET | Freq: Three times a day (TID) | ORAL | 0 refills | Status: DC | PRN

## 2022-08-01 MED ORDER — SODIUM CHLORIDE 0.9 % IV BOLUS
1000.0000 mL | Freq: Once | INTRAVENOUS | Status: AC
  Administered 2022-08-01: 1000 mL via INTRAVENOUS

## 2022-08-01 MED ORDER — ONDANSETRON HCL 4 MG/2ML IJ SOLN
4.0000 mg | Freq: Once | INTRAMUSCULAR | Status: AC
  Administered 2022-08-01: 4 mg via INTRAVENOUS
  Filled 2022-08-01: qty 2

## 2022-08-01 MED ORDER — ALUM & MAG HYDROXIDE-SIMETH 200-200-20 MG/5ML PO SUSP
30.0000 mL | Freq: Once | ORAL | Status: AC
  Administered 2022-08-01: 30 mL via ORAL
  Filled 2022-08-01: qty 30

## 2022-08-01 MED ORDER — IOHEXOL 300 MG/ML  SOLN
100.0000 mL | Freq: Once | INTRAMUSCULAR | Status: AC | PRN
  Administered 2022-08-01: 100 mL via INTRAVENOUS

## 2022-08-01 MED ORDER — FAMOTIDINE IN NACL 20-0.9 MG/50ML-% IV SOLN
20.0000 mg | Freq: Once | INTRAVENOUS | Status: AC
  Administered 2022-08-01: 20 mg via INTRAVENOUS
  Filled 2022-08-01: qty 50

## 2022-08-01 NOTE — ED Provider Notes (Signed)
[facility] EMERGENCY DEPARTMENT Provider Note   CSN: [id number] Arrival date & time: 08/01/22  1137     History  Chief Complaint  Patient presents with   Weakness    Curtis Morris is a 81 y.o. male.  Pt is a 81 yo male with a pmhx significant for HTN, DM, HLD, afib on eliquis, chronic pancreatitis, PUD s/p partial gastrectomy.  Pt said he has had stomach problems since he was in his 84s and a large portion of his stomach was removed.  He had eye surgery and then had his teeth removed in October, and feels like sx have been worse.  Pt said he's been unable to eat because his gums are still sore.  He feels nauseous all the time.  He said sx are worse at night and when he lies down.  He does take pantoprazole twice a day and has been compliant.  Pt said he was so nauseous today that he was unable to eat.  He felt like he was going to pass out.  He did feel constipated yesterday, but took a laxative and he had a large bm.  He feels like that problem is better.  No f/c.  No pain.       Home Medications Prior to Admission medications   Medication Sig Start Date End Date Taking? Authorizing Provider  famotidine (PEPCID) 20 MG tablet Take 1 tablet (20 mg total) by mouth daily. 08/01/22  Yes Jacalyn Lefevre, MD  ondansetron (ZOFRAN-ODT) 4 MG disintegrating tablet Take 1 tablet (4 mg total) by mouth every 8 (eight) hours as needed. 08/01/22  Yes Jacalyn Lefevre, MD  apixaban (ELIQUIS) 5 MG TABS tablet Take 1 tablet (5 mg total) by mouth 2 (two) times daily. 12/02/21   Azucena Fallen, MD  aspirin EC 81 MG tablet Take 81 mg by mouth daily. Swallow whole.    [provider]  ATORVASTATIN CALCIUM PO Take 20 mg by mouth at bedtime.    [provider]  Calcium Carbonate-Vitamin D (CALCIUM-VITAMIN D3 PO) Take 1 tablet by mouth 2 (two) times daily.    [provider]  furosemide (LASIX) 20 MG tablet Take 1 tablet (20 mg total) by mouth daily. 05/01/22   Sabas Sous, MD  lipase/protease/amylase (CREON) 12000 units CPEP capsule Take 12,000 Units by mouth 2 (two) times daily.    [provider]  magnesium oxide (MAG-OX) 400 MG tablet Take 400 mg by mouth daily.    [provider]  metFORMIN (GLUCOPHAGE) 500 MG tablet Take 500 mg by mouth every evening.    [provider]  metoprolol tartrate (LOPRESSOR) 50 MG tablet Take 1 tablet (50 mg total) by mouth 2 (two) times daily. 11/27/21   Azucena Fallen, MD  ondansetron (ZOFRAN ODT) 4 MG disintegrating tablet Take 1 tablet (4 mg total) by mouth every 8 (eight) hours as needed for nausea or vomiting. Patient not taking: Reported on 11/24/2021 08/04/18   Little, Ambrose Finland, MD  ondansetron (ZOFRAN) 8 MG tablet Take 1 tablet (8 mg total) by mouth every 8 (eight) hours as needed for nausea or vomiting. 12/06/21   Al Decant, PA-C  ondansetron (ZOFRAN-ODT) 4 MG disintegrating tablet Take 1 tablet (4 mg total) by mouth every 8 (eight) hours as needed for nausea or vomiting. 05/01/22   Sabas Sous, MD  oxyCODONE-acetaminophen (PERCOCET/ROXICET) 5-325 MG tablet Take 1 tablet by mouth every 6 (six) hours as needed for severe pain. 12/06/21  Al DecantGroce, Christopher F, PA-C  pantoprazole (PROTONIX) 40 MG tablet Take 40 mg by mouth 2 (two) times daily.    [provider]  tamsulosin (FLOMAX) 0.4 MG CAPS capsule Take 0.4 mg by mouth at bedtime.    [provider]      Allergies    Patient has no known allergies.    Review of Systems   Review of Systems  Gastrointestinal:  Positive for nausea and vomiting.  Neurological:  Positive for syncope and weakness.  All other systems reviewed and are negative.   Physical Exam Updated Vital Signs BP (!) 164/107   Pulse 76   Temp 98.6 F (37 C) (Oral)   Resp 17   Ht 5\' 11"  (1.803 m)   Wt 61.2 kg   SpO2 98%   BMI 18.83 kg/m  Physical Exam Vitals and nursing note reviewed.  Constitutional:      Appearance: Normal  appearance.  HENT:     Head: Normocephalic and atraumatic.     Right Ear: External ear normal.     Left Ear: External ear normal.     Nose: Nose normal.     Mouth/Throat:     Mouth: Mucous membranes are dry.  Eyes:     Extraocular Movements: Extraocular movements intact.     Conjunctiva/sclera: Conjunctivae normal.     Pupils: Pupils are equal, round, and reactive to light.  Cardiovascular:     Rate and Rhythm: Normal rate. Rhythm irregular.     Pulses: Normal pulses.     Heart sounds: Normal heart sounds.  Pulmonary:     Effort: Pulmonary effort is normal.     Breath sounds: Normal breath sounds.  Abdominal:     General: Abdomen is flat. Bowel sounds are normal.     Palpations: Abdomen is soft.  Musculoskeletal:        General: Normal range of motion.     Cervical back: Normal range of motion and neck supple.  Skin:    General: Skin is warm.     Capillary Refill: Capillary refill takes less than 2 seconds.  Neurological:     General: No focal deficit present.     Mental Status: He is alert and oriented to person, place, and time.  Psychiatric:        Mood and Affect: Mood normal.        Behavior: Behavior normal.     ED Results / Procedures / Treatments   Labs (all labs ordered are listed, but only abnormal results are displayed) Labs Reviewed  CBC WITH DIFFERENTIAL/PLATELET - Abnormal; Notable for the following components:      Result Value   RBC 3.80 (*)    Hemoglobin 11.3 (*)    HCT 36.1 (*)    RDW 16.7 (*)    All other components within normal limits  COMPREHENSIVE METABOLIC PANEL - Abnormal; Notable for the following components:   Glucose, Bld 108 (*)    All other components within normal limits  URINALYSIS, ROUTINE W REFLEX MICROSCOPIC - Abnormal; Notable for the following components:   APPearance CLOUDY (*)    All other components within normal limits  LIPASE, BLOOD    EKG EKG Interpretation  Date/Time:  Tuesday August 01 2022 12:10:26  EST Ventricular Rate:  65 PR Interval:    QRS Duration: 94 QT Interval:  426 QTC Calculation: 443 R Axis:   -72 Text Interpretation: Atrial fibrillation with premature ventricular or aberrantly conducted complexes Left axis deviation Anteroseptal infarct ,  age undetermined Abnormal ECG When compared with ECG of 01-May-2022 03:36, PREVIOUS ECG IS PRESENT No significant change since last tracing Confirmed by Jacalyn Lefevre [id number]) on 08/01/2022 3:13:17 PM  Radiology CT ABDOMEN PELVIS W CONTRAST  Result Date: 08/01/2022 CLINICAL DATA:  Nausea, shortness of breath, abdominal pain, generalized weakness, had a syncopal episode today while sitting; dental work 3 days ago, has not been eating EXAM: CT ABDOMEN AND PELVIS WITH CONTRAST TECHNIQUE: Multidetector CT imaging of the abdomen and pelvis was performed using the standard protocol following bolus administration of intravenous contrast. RADIATION DOSE REDUCTION: This exam was performed according to the departmental dose-optimization program which includes automated exposure control, adjustment of the mA and/or kV according to patient size and/or use of iterative reconstruction technique. CONTRAST:  OMNIPAQUE IOHEXOL 300 MG/ML SOLN IV. No oral contrast. COMPARISON:  12/06/2021 FINDINGS: Lower chest: Small RIGHT and trace LEFT pleural effusions. Pleural calcifications inferior LEFT hemithorax. Minimal LEFT basilar scarring and RIGHT basilar atelectasis Hepatobiliary: Gallbladder surgically absent. Few calcified granulomata within liver. Small hypervascular focus superiorly RIGHT lobe 7 mm question atypical hemangioma, unchanged. Prominent CBD 9 mm, likely normal post cholecystectomy. Pancreas: Atrophic pancreas. Spleen: Spleen surgically absent. Small amount of enhancing soft tissue under the LEFT hemidiaphragm question minimal splenosis, unchanged. Adrenals/Urinary Tract: Adrenal glands normal. Small cortical scar posterior lower LEFT kidney. Small  simple appearing RIGHT renal cysts up to 12 mm diameter upper pole LEFT kidney image 14; no follow-up imaging recommended. Kidneys, ureters, and bladder otherwise normal appearance. Stomach/Bowel: Appendix surgically absent by history. Colon under distended, with suboptimal assessment of wall thickness, no focal abnormality seen. Prior distal gastrectomy and gastrojejunostomy. Remaining stomach and bowel loops unremarkable. Vascular/Lymphatic: Atherosclerotic calcifications aorta, iliac arteries, coronary arteries. Enlargement of cardiac chambers. Aorta normal caliber. No adenopathy. Reproductive: Enlarged prostate gland Other: Small amount of free fluid in pelvis. No free air. No hernia. Musculoskeletal: Osseous demineralization with degenerative disc disease changes L4-L5. 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 Small RIGHT and trace LEFT pleural effusions with RIGHT basilar atelectasis. Enlarged prostate gland. Small amount of nonspecific free pelvic fluid. Prior distal gastrectomy and gastrojejunostomy. No definite acute intra-abdominal or intrapelvic abnormalities. Aortic Atherosclerosis (ICD10-I70.0). Electronically Signed   By: Ulyses Southward M.D.   On: 08/01/2022 17:10   DG Chest 2 View  Result Date: 08/01/2022 CLINICAL DATA:  Weakness EXAM: CHEST - 2 VIEW COMPARISON:  05/01/2022 FINDINGS: Chronic cardiomegaly. Aortic atherosclerosis and tortuosity. Chronic pulmonary scarring. Surgical clips in the distal esophagus/gastroesophageal junction region. No sign of acute pneumonia. No acute bone finding. IMPRESSION: Chronic cardiomegaly and aortic atherosclerosis. Chronic pulmonary scarring. No acute finding. Electronically Signed   By: Paulina Fusi M.D.   On: 08/01/2022 12:55    Procedures Procedures    Medications Ordered in ED Medications  sodium chloride 0.9 % bolus 1,000 mL (1,000 mLs Intravenous New Bag/Given 08/01/22 1611)  ondansetron (ZOFRAN) injection 4  mg (4 mg Intravenous Given 08/01/22 1606)  alum & mag hydroxide-simeth (MAALOX/MYLANTA) 200-200-20 MG/5ML suspension 30 mL (30 mLs Oral Given 08/01/22 1604)  famotidine (PEPCID) IVPB 20 mg premix (20 mg Intravenous New Bag/Given 08/01/22 1609)  iohexol (OMNIPAQUE) 300 MG/ML solution 100 mL (100 mLs Intravenous Contrast Given 08/01/22 1640)    ED Course/ Medical Decision Making/ A&P                           Medical Decision Making Amount and/or Complexity of Data Reviewed Radiology: ordered.  Risk OTC drugs. Prescription drug management.   This patient presents to the ED for concern of n/v, this involves an extensive number of treatment options, and is a complaint that carries with it a high risk of complications and morbidity.  The differential diagnosis includes infection, gerd, sbo, electrolyte abn   Co morbidities that complicate the patient evaluation  HTN, DM, HLD, afib on eliquis, chronic pancreatitis, PUD s/p partial gastrectomy   Additional history obtained:  Additional history obtained from epic chart review External records from outside source obtained and reviewed including wife   Lab Tests:  I Ordered, and personally interpreted labs.  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    Small RIGHT and trace LEFT pleural effusions with RIGHT basilar  atelectasis.    Enlarged prostate gland.    Small amount of nonspecific free pelvic fluid.    Prior distal gastrectomy and gastrojejunostomy.    No definite acute intra-abdominal or intrapelvic abnormalities.   CXR: Chronic cardiomegaly and aortic atherosclerosis. Chronic pulmonary  scarring. No acute finding   I agree with the radiologist  interpretation   Cardiac Monitoring:  The patient was maintained on a cardiac monitor.  I personally viewed and interpreted the cardiac monitored which showed an underlying rhythm of: afib   Medicines ordered and prescription drug management:  I ordered medication including ivfs/zofran/pepcid/gi cocktail  for sx  Reevaluation of the patient after these medicines showed that the patient improved I have reviewed the patients home medicines and have made adjustments as needed   Test Considered:  ct   Critical Interventions:  meds   Problem List / ED Course:  Nausea:  I suspect this is due to reflux.  He does not have a GI doc and wants to f/u with the [place].  His wife will call them tomorrow to make an appt.  Pt is encouraged to return if worse.  F/u with GI/PCP.   Reevaluation:  After the interventions noted above, I reevaluated the patient and found that they have :improved   Social Determinants of Health:  Lives at home   Dispostion:  After consideration of the diagnostic results and the patients response to treatment, I feel that the patent would benefit from discharge with outpatient f/u.          Final Clinical Impression(s) / ED Diagnoses Final diagnoses:  Weakness  Dehydration  Nausea    Rx / DC Orders ED Discharge Orders          Ordered    ondansetron (ZOFRAN-ODT) 4 MG disintegrating tablet  Every 8 hours PRN        08/01/22 1724    famotidine (PEPCID) 20 MG tablet  Daily        08/01/22 1724              Jacalyn Lefevre, MD 08/01/22 1726

## 2022-08-01 NOTE — ED Triage Notes (Signed)
Pt in c/o generalized weakness and syncopal episode today while sitting, not witnessed, per pts wife he had some dental work completed x 3 days ago and has not been able to eat since then, pt c/o nausea, pt denies v/d, pt denies CP, pt c/o SOB, A&O x4

## 2022-08-01 NOTE — ED Provider Triage Note (Signed)
Emergency Medicine Provider Triage Evaluation Note  Curtis Morris , a 81 y.o. male  was evaluated in triage.  Pt complains of weakness, Pt had eye surgery then dental extractions and has been weak since.  Pt had near syncope . Frequent urination chronic constipation,  took at laxative yesterday  Review of Systems  Positive: Weakness and nausea Negative: fever  Physical Exam  BP (!) 140/77 (BP Location: Right Arm)   Pulse (!) 57   Temp 97.7 F (36.5 C) (Oral)   Resp 17   Ht 5\' 11"  (1.803 m)   Wt 61.2 kg   SpO2 99%   BMI 18.83 kg/m  Gen:   Awake, appear s weak Resp:  Normal effort  MSK:   Moves extremities without difficulty  Other:    Medical Decision Making  Medically screening exam initiated at 12:25 PM.  Appropriate orders placed.  was informed that the remainder of the evaluation will be completed by another provider, this initial triage assessment does not replace that evaluation, and the importance of remaining in the ED until their evaluation is complete.     Lysle Rubens, [place] 08/01/22 1230

## 2022-09-27 ENCOUNTER — Encounter (HOSPITAL_BASED_OUTPATIENT_CLINIC_OR_DEPARTMENT_OTHER): Payer: Self-pay

## 2022-09-27 DIAGNOSIS — G4739 Other sleep apnea: Secondary | ICD-10-CM

## 2022-10-24 ENCOUNTER — Ambulatory Visit: Payer: No Typology Code available for payment source | Attending: Internal Medicine | Admitting: Pulmonary Disease

## 2022-10-24 DIAGNOSIS — G4733 Obstructive sleep apnea (adult) (pediatric): Secondary | ICD-10-CM

## 2022-10-24 DIAGNOSIS — G4752 REM sleep behavior disorder: Secondary | ICD-10-CM | POA: Insufficient documentation

## 2022-10-24 DIAGNOSIS — G4739 Other sleep apnea: Secondary | ICD-10-CM

## 2022-10-26 DIAGNOSIS — G4739 Other sleep apnea: Secondary | ICD-10-CM

## 2022-10-26 DIAGNOSIS — G4733 Obstructive sleep apnea (adult) (pediatric): Secondary | ICD-10-CM | POA: Diagnosis not present

## 2022-10-26 NOTE — Procedures (Signed)
Patient Name: Curtis Morris, Curtis Morris Date: 10/24/2022 Gender: Male D.O.B: 28-Dec-1940 Age (years): 46 Referring Provider: Pinakin Vias Height (inches): 71 Interpreting Physician: Kara Mead MD, ABSM Weight (lbs): 121 RPSGT: Rosebud Poles BMI: 17 MRN: [id number] Neck Size: 14.00 <br> <br> CLINICAL INFORMATION Sleep Study Type: NPSG    Indication for sleep study:  loud snoring, non refreshing sleep    Epworth Sleepiness Score:10    SLEEP STUDY TECHNIQUE As per the AASM Manual for the Scoring of Sleep and Associated Events v2.3 (April 2016) with a hypopnea requiring 4% desaturations.  The channels recorded and monitored were frontal, central and occipital EEG, electrooculogram (EOG), submentalis EMG (chin), nasal and oral airflow, thoracic and abdominal wall motion, anterior tibialis EMG, snore microphone, electrocardiogram, and pulse oximetry.  MEDICATIONS Medications self-administered by patient taken the night of the study : N/A  SLEEP ARCHITECTURE The study was initiated at 9:40:34 PM and ended at 4:13:05 AM.  Sleep onset time was 11.3 minutes and the sleep efficiency was 73.8%. The total sleep time was 289.8 minutes.  Stage REM latency was 287.5 minutes.  The patient spent 7.94% of the night in stage N1 sleep, 79.98% in stage N2 sleep, 5.87% in stage N3 and 6.2% in REM.  Alpha intrusion was absent.  Supine sleep was 70.84%.  RESPIRATORY PARAMETERS The overall apnea/hypopnea index (AHI) was 47.6 per hour. There were 13 total apneas, including 2 obstructive, 6 central and 5 mixed apneas. There were 217 hypopneas and 1 RERAs.  The AHI during Stage REM sleep was 10.0 per hour.  AHI while supine was 50.0 per hour.  The mean oxygen saturation was 93.56%. The minimum SpO2 during sleep was 83.00%.  moderate snoring was noted during this study.  CARDIAC DATA The 2 lead EKG demonstrated sinus rhythm. The mean heart rate was 58.68 beats per minute. Other EKG findings  include: Atrial Fibrillation, PVCs.   LEG MOVEMENT DATA The total PLMS were 0 with a resulting PLMS index of 0.00. Associated arousal with leg movement index was 0.0 .  IMPRESSIONS - Severe obstructive sleep apnea occurred during this study (AHI = 47.6/h). - No significant central sleep apnea occurred during this study (CAI = 1.2/h). - Mild oxygen desaturation was noted during this study (Min O2 = 83.00%). - The patient snored with moderate snoring volume. - EKG findings include Atrial Fibrillation, PVCs. - Clinically significant periodic limb movements did not occur during sleep. No significant associated arousals. - Arm & leg movement noted during REM sleep with sleep talking - Patient could not tolerate CPAP due to eye pain & nausea.   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 Consider hypoglossal nerve implantation option if he is truly CPAP intolerant - Evaluate for REM behavior disorder - Avoid alcohol, sedatives and other CNS depressants that may worsen sleep apnea and disrupt normal sleep architecture. - Sleep hygiene should be reviewed to assess factors that may improve sleep quality. - Weight management and regular exercise should be initiated or continued if appropriate.   Kara Mead MD Board Certified in [place]

## 2022-12-19 ENCOUNTER — Emergency Department (HOSPITAL_COMMUNITY): Payer: No Typology Code available for payment source

## 2022-12-19 ENCOUNTER — Emergency Department (HOSPITAL_COMMUNITY)
Admission: EM | Admit: 2022-12-19 | Discharge: 2022-12-19 | Disposition: A | Discharge status: Home / Self Care | Payer: No Typology Code available for payment source | Attending: Emergency Medicine | Admitting: Emergency Medicine

## 2022-12-19 ENCOUNTER — Other Ambulatory Visit: Payer: Self-pay

## 2022-12-19 DIAGNOSIS — Z7901 Long term (current) use of anticoagulants: Secondary | ICD-10-CM | POA: Diagnosis not present

## 2022-12-19 DIAGNOSIS — R0789 Other chest pain: Secondary | ICD-10-CM

## 2022-12-19 DIAGNOSIS — I4819 Other persistent atrial fibrillation: Secondary | ICD-10-CM | POA: Insufficient documentation

## 2022-12-19 DIAGNOSIS — Z7982 Long term (current) use of aspirin: Secondary | ICD-10-CM | POA: Diagnosis not present

## 2022-12-19 DIAGNOSIS — R072 Precordial pain: Secondary | ICD-10-CM

## 2022-12-19 DIAGNOSIS — R079 Chest pain, unspecified: Secondary | ICD-10-CM | POA: Diagnosis present

## 2022-12-19 LAB — BASIC METABOLIC PANEL
Anion gap: 6 (ref 5–15)
BUN: 19 mg/dL (ref 8–23)
CO2: 26 mmol/L (ref 22–32)
Calcium: 8.6 mg/dL — ABNORMAL LOW (ref 8.9–10.3)
Chloride: 103 mmol/L (ref 98–111)
Creatinine, Ser: 1.08 mg/dL (ref 0.61–1.24)
GFR, Estimated: >60 mL/min (ref >60)
Glucose, Bld: 266 mg/dL — ABNORMAL HIGH (ref 70–99)
Potassium: 4.4 mmol/L (ref 3.5–5.1)
Sodium: 135 mmol/L (ref 135–145)

## 2022-12-19 LAB — CBC
HCT: 32.5 % — ABNORMAL LOW (ref 39.0–52.0)
Hemoglobin: 10 g/dL — ABNORMAL LOW (ref 13.0–17.0)
MCH: 29.1 pg (ref 26.0–34.0)
MCHC: 30.8 g/dL (ref 30.0–36.0)
MCV: 94.5 fL (ref 80.0–100.0)
Platelets: 338 10*3/uL (ref 150–400)
RBC: 3.44 MIL/uL — ABNORMAL LOW (ref 4.22–5.81)
RDW: 16.8 % — ABNORMAL HIGH (ref 11.5–15.5)
WBC: 5.5 10*3/uL (ref 4.0–10.5)
nRBC: 0 % (ref 0.0–0.2)

## 2022-12-19 LAB — TROPONIN I (HIGH SENSITIVITY)
Troponin I (High Sensitivity): 6 ng/L (ref <18)
Troponin I (High Sensitivity): 7 ng/L (ref <18)

## 2022-12-19 MED ORDER — ACETAMINOPHEN 500 MG PO TABS
1000.0000 mg | ORAL_TABLET | Freq: Once | ORAL | Status: DC
  Filled 2022-12-19: qty 2

## 2022-12-19 NOTE — ED Triage Notes (Signed)
Pt with stabbing right sided chest pain since 3 today. Increasing in frequency and intensity.  No n/v/d or shob. Does have afib. Compliant with eliquis.

## 2022-12-19 NOTE — Discharge Instructions (Addendum)
It was our pleasure to provide your ER care today - we hope that you feel better. Overall, your heart tests done in the ER look good.   Take acetaminophen as need.   Follow up closely with your doctor/cardiologist in the next 1-2 weeks.   Return to ER if worse, new symptoms, increased trouble breathing, persistent/recurrent chest pain, or other concern.

## 2022-12-19 NOTE — ED Provider Notes (Signed)
[place] EMERGENCY DEPARTMENT AT [facility] Provider Note   CSN: [id number] Arrival date & time: 12/19/22  1904     History  Chief Complaint  Patient presents with   Chest Pain    EQUAN EISENZIMMER is a 82 y.o. male.  Patient with c/o sharp right chest pain at rest today around 3 pm. Gets sharp pain lasting ~ 5-10 seconds, at rest. Not pleuritic. No constant and/or pleuritic pain. Hx afib. Compliant w eliquis tx. Denies palpitations. No associated sob, nv or diaphoresis. No unusual doe or fatigue. No leg pain or swelling. Denies chest wall injury or strain. No heartburn. No exertional chest pain or discomfort. No fever or chills.   The history is provided by the patient and medical records.  Chest Pain Associated symptoms: no abdominal pain, no back pain, no cough, no fever, no headache, no nausea, no palpitations, no shortness of breath and no vomiting        Home Medications Prior to Admission medications   Medication Sig Start Date End Date Taking? Authorizing Provider  apixaban (ELIQUIS) 5 MG TABS tablet Take 1 tablet (5 mg total) by mouth 2 (two) times daily. 12/02/21   Azucena Fallen, MD  aspirin EC 81 MG tablet Take 81 mg by mouth daily. Swallow whole.    [provider]  ATORVASTATIN CALCIUM PO Take 20 mg by mouth at bedtime.    [provider]  Calcium Carbonate-Vitamin D (CALCIUM-VITAMIN D3 PO) Take 1 tablet by mouth 2 (two) times daily.    [provider]  famotidine (PEPCID) 20 MG tablet Take 1 tablet (20 mg total) by mouth daily. 08/01/22   Jacalyn Lefevre, MD  furosemide (LASIX) 20 MG tablet Take 1 tablet (20 mg total) by mouth daily. 05/01/22   Sabas Sous, MD  lipase/protease/amylase (CREON) 12000 units CPEP capsule Take 12,000 Units by mouth 2 (two) times daily.    [provider]  magnesium oxide (MAG-OX) 400 MG tablet Take 400 mg by mouth daily.    [provider]  metFORMIN (GLUCOPHAGE) 500 MG  tablet Take 500 mg by mouth every evening.    [provider]  metoprolol tartrate (LOPRESSOR) 50 MG tablet Take 1 tablet (50 mg total) by mouth 2 (two) times daily. 11/27/21   Azucena Fallen, MD  ondansetron (ZOFRAN ODT) 4 MG disintegrating tablet Take 1 tablet (4 mg total) by mouth every 8 (eight) hours as needed for nausea or vomiting. Patient not taking: Reported on 11/24/2021 08/04/18   Little, Ambrose Finland, MD  ondansetron (ZOFRAN) 8 MG tablet Take 1 tablet (8 mg total) by mouth every 8 (eight) hours as needed for nausea or vomiting. 12/06/21   Al Decant, PA-C  ondansetron (ZOFRAN-ODT) 4 MG disintegrating tablet Take 1 tablet (4 mg total) by mouth every 8 (eight) hours as needed for nausea or vomiting. 05/01/22   Sabas Sous, MD  ondansetron (ZOFRAN-ODT) 4 MG disintegrating tablet Take 1 tablet (4 mg total) by mouth every 8 (eight) hours as needed. 08/01/22   Jacalyn Lefevre, MD  oxyCODONE-acetaminophen (PERCOCET/ROXICET) 5-325 MG tablet Take 1 tablet by mouth every 6 (six) hours as needed for severe pain. 12/06/21   Al Decant, PA-C  pantoprazole (PROTONIX) 40 MG tablet Take 40 mg by mouth 2 (two) times daily.    [provider]  tamsulosin (FLOMAX) 0.4 MG CAPS capsule Take 0.4 mg by mouth at bedtime.    [provider]  Allergies    Patient has no known allergies.    Review of Systems   Review of Systems  Constitutional:  Negative for chills and fever.  HENT:  Negative for sore throat.   Eyes:  Negative for redness.  Respiratory:  Negative for cough and shortness of breath.   Cardiovascular:  Positive for chest pain. Negative for palpitations and leg swelling.  Gastrointestinal:  Negative for abdominal pain, nausea and vomiting.  Genitourinary:  Negative for flank pain.  Musculoskeletal:  Negative for back pain and neck pain.  Skin:  Negative for rash.  Neurological:  Negative for headaches.  Psychiatric/Behavioral:  Negative  for confusion.     Physical Exam Updated Vital Signs BP 119/67 (BP Location: Right Arm)   Pulse 63   Temp 97.8 F (36.6 C) (Oral)   Resp 15   SpO2 99%  Physical Exam Vitals and nursing note reviewed.  Constitutional:      Appearance: Normal appearance. He is well-developed.  HENT:     Head: Atraumatic.     Nose: Nose normal.     Mouth/Throat:     Mouth: Mucous membranes are moist.     Pharynx: Oropharynx is clear.  Eyes:     General: No scleral icterus.    Conjunctiva/sclera: Conjunctivae normal.  Neck:     Trachea: No tracheal deviation.  Cardiovascular:     Rate and Rhythm: Normal rate and regular rhythm.     Pulses: Normal pulses.     Heart sounds: Normal heart sounds. No murmur heard.    No friction rub. No gallop.  Pulmonary:     Effort: Pulmonary effort is normal. No accessory muscle usage or respiratory distress.     Breath sounds: Normal breath sounds.  Chest:     Chest wall: No tenderness.  Abdominal:     General: There is no distension.     Palpations: Abdomen is soft.     Tenderness: There is no abdominal tenderness.  Musculoskeletal:        General: No swelling or tenderness.     Cervical back: Normal range of motion and neck supple. No rigidity.     Right lower leg: No edema.     Left lower leg: No edema.  Skin:    General: Skin is warm and dry.     Findings: No rash.  Neurological:     Mental Status: He is alert.     Comments: Alert, speech clear.   Psychiatric:        Mood and Affect: Mood normal.     ED Results / Procedures / Treatments   Labs (all labs ordered are listed, but only abnormal results are displayed) Results for orders placed or performed during the hospital encounter of 12/19/22  Basic metabolic panel  Result Value Ref Range   Sodium 135 135 - 145 mmol/L   Potassium 4.4 3.5 - 5.1 mmol/L   Chloride 103 98 - 111 mmol/L   CO2 26 22 - 32 mmol/L   Glucose, Bld 266 (H) 70 - 99 mg/dL   BUN 19 8 - 23 mg/dL   Creatinine, Ser  1.61 0.61 - 1.24 mg/dL   Calcium 8.6 (L) 8.9 - 10.3 mg/dL   GFR, Estimated >09 >60 mL/min   Anion gap 6 5 - 15  CBC  Result Value Ref Range   WBC 5.5 4.0 - 10.5 K/uL   RBC 3.44 (L) 4.22 - 5.81 MIL/uL   Hemoglobin 10.0 (L) 13.0 - 17.0 g/dL  HCT 32.5 (L) 39.0 - 52.0 %   MCV 94.5 80.0 - 100.0 fL   MCH 29.1 26.0 - 34.0 pg   MCHC 30.8 30.0 - 36.0 g/dL   RDW 69.6 (H) 29.5 - 28.4 %   Platelets 338 150 - 400 K/uL   nRBC 0.0 0.0 - 0.2 %  Troponin I (High Sensitivity)  Result Value Ref Range   Troponin I (High Sensitivity) 6 <18 ng/L  Troponin I (High Sensitivity)  Result Value Ref Range   Troponin I (High Sensitivity) 7 <18 ng/L   DG Chest Port 1 View  Result Date: 12/19/2022 CLINICAL DATA:  Right-sided chest pain. EXAM: PORTABLE CHEST 1 VIEW COMPARISON:  August 01, 2022 FINDINGS: The cardiac silhouette is mildly enlarged and unchanged in size. Mild-to-moderate severity diffuse, chronic appearing increased interstitial lung markings are seen. Chronic linear scarring is also seen overlying the mid left lung. There is no evidence of an acute infiltrate, pleural effusion or pneumothorax. Radiopaque surgical clips are seen overlying the right upper quadrant and medial aspect of the left lung base. The visualized skeletal structures are unremarkable. IMPRESSION: Chronic appearing increased interstitial lung markings and linear scarring within the mid left lung. Electronically Signed   By: Aram Candela M.D.   On: 12/19/2022 19:38   SLEEP STUDY DOCUMENTS  Result Date: 12/05/2022 Ordered by an unspecified provider.   EKG EKG Interpretation  Date/Time:  Tuesday Dec 19 2022 19:17:13 EDT Ventricular Rate:  77 PR Interval:    QRS Duration: 113 QT Interval:  411 QTC Calculation: 466 R Axis:   96 Text Interpretation: Atrial fibrillation Ventricular premature complex Non-specific ST-t changes Confirmed by Cathren Laine ([id number]) on 12/19/2022 7:56:25 PM  Radiology DG Chest Port 1  View  Result Date: 12/19/2022 CLINICAL DATA:  Right-sided chest pain. EXAM: PORTABLE CHEST 1 VIEW COMPARISON:  August 01, 2022 FINDINGS: The cardiac silhouette is mildly enlarged and unchanged in size. Mild-to-moderate severity diffuse, chronic appearing increased interstitial lung markings are seen. Chronic linear scarring is also seen overlying the mid left lung. There is no evidence of an acute infiltrate, pleural effusion or pneumothorax. Radiopaque surgical clips are seen overlying the right upper quadrant and medial aspect of the left lung base. The visualized skeletal structures are unremarkable. IMPRESSION: Chronic appearing increased interstitial lung markings and linear scarring within the mid left lung. Electronically Signed   By: Aram Candela M.D.   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 Labs: ordered. Decision-making details documented in ED Course. Radiology: ordered and independent interpretation performed. Decision-making details documented in ED Course. ECG/medicine tests: ordered and independent interpretation performed. Decision-making details documented in ED Course.  Risk OTC drugs. Decision regarding hospitalization.   Iv ns. Continuous pulse ox and cardiac monitoring. Labs  ordered/sent. Imaging  ordered.   Differential diagnosis includes ACS, msk cp, gi cp, etc. Dispo decision including potential need for admission considered - will get labs and imaging and reassess.   Reviewed nursing notes and prior charts for additional history. External reports reviewed. Similar eval for atypical chest pain 10/22, cath and cta neg then. Additional history from: family.   Cardiac monitor: afib, rate 66.   Labs reviewed/interpreted by me - initial and delta trop normal  - felt not c/w acs.   Xrays reviewed/interpreted by me - no pna.   Pt currently appears stable for d/c.   Rec close pcp f/u.  Return precautions provided.          Final Clinical Impression(s) / ED Diagnoses Final diagnoses:  None    Rx / DC Orders ED Discharge Orders     None         Cathren Laine, MD 12/19/22 2143

## 2023-01-08 ENCOUNTER — Emergency Department (HOSPITAL_COMMUNITY): Payer: No Typology Code available for payment source

## 2023-01-08 ENCOUNTER — Encounter (HOSPITAL_COMMUNITY): Payer: Self-pay

## 2023-01-08 ENCOUNTER — Other Ambulatory Visit: Payer: Self-pay

## 2023-01-08 ENCOUNTER — Emergency Department (HOSPITAL_COMMUNITY)
Admission: EM | Admit: 2023-01-08 | Discharge: 2023-01-08 | Disposition: A | Discharge status: Home / Self Care | Payer: No Typology Code available for payment source | Attending: Emergency Medicine | Admitting: Emergency Medicine

## 2023-01-08 DIAGNOSIS — Z7984 Long term (current) use of oral hypoglycemic drugs: Secondary | ICD-10-CM | POA: Insufficient documentation

## 2023-01-08 DIAGNOSIS — E119 Type 2 diabetes mellitus without complications: Secondary | ICD-10-CM | POA: Diagnosis not present

## 2023-01-08 DIAGNOSIS — R591 Generalized enlarged lymph nodes: Secondary | ICD-10-CM | POA: Diagnosis not present

## 2023-01-08 DIAGNOSIS — I10 Essential (primary) hypertension: Secondary | ICD-10-CM | POA: Insufficient documentation

## 2023-01-08 DIAGNOSIS — Z79899 Other long term (current) drug therapy: Secondary | ICD-10-CM | POA: Diagnosis not present

## 2023-01-08 DIAGNOSIS — Z7901 Long term (current) use of anticoagulants: Secondary | ICD-10-CM | POA: Insufficient documentation

## 2023-01-08 DIAGNOSIS — R1909 Other intra-abdominal and pelvic swelling, mass and lump: Secondary | ICD-10-CM | POA: Diagnosis present

## 2023-01-08 LAB — COMPREHENSIVE METABOLIC PANEL
ALT: 33 U/L (ref 0–44)
AST: 27 U/L (ref 15–41)
Albumin: 3.1 g/dL — ABNORMAL LOW (ref 3.5–5.0)
Alkaline Phosphatase: 49 U/L (ref 38–126)
Anion gap: 5 (ref 5–15)
BUN: 19 mg/dL (ref 8–23)
CO2: 27 mmol/L (ref 22–32)
Calcium: 8.9 mg/dL (ref 8.9–10.3)
Chloride: 101 mmol/L (ref 98–111)
Creatinine, Ser: 0.96 mg/dL (ref 0.61–1.24)
GFR, Estimated: >60 mL/min (ref >60)
Glucose, Bld: 116 mg/dL — ABNORMAL HIGH (ref 70–99)
Potassium: 4.8 mmol/L (ref 3.5–5.1)
Sodium: 133 mmol/L — ABNORMAL LOW (ref 135–145)
Total Bilirubin: 0.6 mg/dL (ref 0.3–1.2)
Total Protein: 5.8 g/dL — ABNORMAL LOW (ref 6.5–8.1)

## 2023-01-08 LAB — CBC WITH DIFFERENTIAL/PLATELET
Abs Immature Granulocytes: 0.03 10*3/uL (ref 0.00–0.07)
Basophils Absolute: 0 10*3/uL (ref 0.0–0.1)
Basophils Relative: 0 %
Eosinophils Absolute: 0.2 10*3/uL (ref 0.0–0.5)
Eosinophils Relative: 2 %
HCT: 31.6 % — ABNORMAL LOW (ref 39.0–52.0)
Hemoglobin: 10 g/dL — ABNORMAL LOW (ref 13.0–17.0)
Immature Granulocytes: 0 %
Lymphocytes Relative: 20 %
Lymphs Abs: 1.8 10*3/uL (ref 0.7–4.0)
MCH: 29.8 pg (ref 26.0–34.0)
MCHC: 31.6 g/dL (ref 30.0–36.0)
MCV: 94 fL (ref 80.0–100.0)
Monocytes Absolute: 1 10*3/uL (ref 0.1–1.0)
Monocytes Relative: 11 %
Neutro Abs: 6 10*3/uL (ref 1.7–7.7)
Neutrophils Relative %: 67 %
Platelets: 322 10*3/uL (ref 150–400)
RBC: 3.36 MIL/uL — ABNORMAL LOW (ref 4.22–5.81)
RDW: 17.2 % — ABNORMAL HIGH (ref 11.5–15.5)
WBC: 9 10*3/uL (ref 4.0–10.5)
nRBC: 0 % (ref 0.0–0.2)

## 2023-01-08 NOTE — ED Triage Notes (Signed)
Pt comes in with complaint of left groin pain. Pt states he was pulling honey suckle bushes yesterday and noticed a knot in his left groin. Pt states a shooting pain from his groin to his calf.

## 2023-01-08 NOTE — Discharge Instructions (Addendum)
Follow-up with Dr. Lovell Sheehan or one of his colleagues in the next few weeks to look at this lymph node and possibly biopsy at

## 2023-01-08 NOTE — ED Provider Notes (Signed)
[place] EMERGENCY DEPARTMENT AT [facility] Provider Note   CSN: [id number] Arrival date & time: 01/08/23  4098     History {Add pertinent medical, surgical, social history, OB history to HPI:1} Chief Complaint  Patient presents with   Groin Pain    Curtis Morris is a 82 y.o. male.  Patient has history of hypertension and diabetes.  He complains of swelling to his left groin   Groin Pain       Home Medications Prior to Admission medications   Medication Sig Start Date End Date Taking? Authorizing Provider  apixaban (ELIQUIS) 5 MG TABS tablet Take 1 tablet (5 mg total) by mouth 2 (two) times daily. 12/02/21   Azucena Fallen, MD  aspirin EC 81 MG tablet Take 81 mg by mouth daily. Swallow whole.    [provider]  ATORVASTATIN CALCIUM PO Take 20 mg by mouth at bedtime.    [provider]  Calcium Carbonate-Vitamin D (CALCIUM-VITAMIN D3 PO) Take 1 tablet by mouth 2 (two) times daily.    [provider]  famotidine (PEPCID) 20 MG tablet Take 1 tablet (20 mg total) by mouth daily. 08/01/22   Jacalyn Lefevre, MD  furosemide (LASIX) 20 MG tablet Take 1 tablet (20 mg total) by mouth daily. 05/01/22   Sabas Sous, MD  lipase/protease/amylase (CREON) 12000 units CPEP capsule Take 12,000 Units by mouth 2 (two) times daily.    [provider]  magnesium oxide (MAG-OX) 400 MG tablet Take 400 mg by mouth daily.    [provider]  metFORMIN (GLUCOPHAGE) 500 MG tablet Take 500 mg by mouth every evening.    [provider]  metoprolol tartrate (LOPRESSOR) 50 MG tablet Take 1 tablet (50 mg total) by mouth 2 (two) times daily. 11/27/21   Azucena Fallen, MD  ondansetron (ZOFRAN ODT) 4 MG disintegrating tablet Take 1 tablet (4 mg total) by mouth every 8 (eight) hours as needed for nausea or vomiting. Patient not taking: Reported on 11/24/2021 08/04/18   Little, Ambrose Finland, MD  ondansetron (ZOFRAN) 8 MG tablet Take  1 tablet (8 mg total) by mouth every 8 (eight) hours as needed for nausea or vomiting. 12/06/21   Al Decant, PA-C  ondansetron (ZOFRAN-ODT) 4 MG disintegrating tablet Take 1 tablet (4 mg total) by mouth every 8 (eight) hours as needed for nausea or vomiting. 05/01/22   Sabas Sous, MD  ondansetron (ZOFRAN-ODT) 4 MG disintegrating tablet Take 1 tablet (4 mg total) by mouth every 8 (eight) hours as needed. 08/01/22   Jacalyn Lefevre, MD  oxyCODONE-acetaminophen (PERCOCET/ROXICET) 5-325 MG tablet Take 1 tablet by mouth every 6 (six) hours as needed for severe pain. 12/06/21   Al Decant, PA-C  pantoprazole (PROTONIX) 40 MG tablet Take 40 mg by mouth 2 (two) times daily.    [provider]  tamsulosin (FLOMAX) 0.4 MG CAPS capsule Take 0.4 mg by mouth at bedtime.    [provider]      Allergies    Patient has no known allergies.    Review of Systems   Review of Systems  Physical Exam Updated Vital Signs BP (!) 118/54   Pulse (!) 51   Resp 17   Ht 5\' 11"  (1.803 m)   Wt 56.7 kg   SpO2 98%   BMI 17.43 kg/m  Physical Exam  ED Results / Procedures / Treatments   Labs (all labs ordered are listed, but only abnormal results are  displayed) Labs Reviewed  CBC WITH DIFFERENTIAL/PLATELET - Abnormal; Notable for the following components:      Result Value   RBC 3.36 (*)    Hemoglobin 10.0 (*)    HCT 31.6 (*)    RDW 17.2 (*)    All other components within normal limits  COMPREHENSIVE METABOLIC PANEL - Abnormal; Notable for the following components:   Sodium 133 (*)    Glucose, Bld 116 (*)    Total Protein 5.8 (*)    Albumin 3.1 (*)    All other components within normal limits    EKG None  Radiology US Pelvis Limited  Result Date: 01/08/2023 CLINICAL DATA:  Left inguinal pain and swelling EXAM: US PELVIS LIMITED COMPARISON:  CT done on 08/01/2022 FINDINGS: There are few reniform shaped nodules with fatty hilum in the subcutaneous plane in left  inguinal region. Largest of the nodes measures 2.1 x 1.1 cm. Findings suggest possible reactive hyperplasia of lymph nodes. There is no abnormal fluid collection or irregular soft tissue mass in the left inguinal region. Few lymph nodes were noted in both inguinal regions in the previous CT. IMPRESSION: There are few lymph nodes in subcutaneous plane in the left inguinal region. Largest of the nodes measures 2.1 x 1.1 cm. There is fatty hilum suggesting possible benign reactive hyperplasia of lymph nodes. Continued clinical observation and follow-up sonogram if warranted may be considered. Electronically Signed   By: Ernie Avena M.D.   On: 01/08/2023 10:04    Procedures Procedures  {Document cardiac monitor, telemetry assessment procedure when appropriate:1}  Medications Ordered in ED Medications - No data to display  ED Course/ Medical Decision Making/ A&P   {   Click here for ABCD2, HEART and other calculatorsREFRESH Note before signing :1}                          Medical Decision Making Amount and/or Complexity of Data Reviewed Labs: ordered. Radiology: ordered.   Patient with lymphadenopathy in the left groin.  He is referred to surgery for possible biopsy  {Document critical care time when appropriate:1} {Document review of labs and clinical decision tools ie heart score, Chads2Vasc2 etc:1}  {Document your independent review of radiology images, and any outside records:1} {Document your discussion with family members, caretakers, and with consultants:1} {Document social determinants of health affecting pt's care:1} {Document your decision making why or why not admission, treatments were needed:1} Final Clinical Impression(s) / ED Diagnoses Final diagnoses:  Lymphadenopathy    Rx / DC Orders ED Discharge Orders     None

## 2023-01-15 ENCOUNTER — Other Ambulatory Visit: Payer: Self-pay

## 2023-01-15 ENCOUNTER — Emergency Department (HOSPITAL_COMMUNITY)
Admission: EM | Admit: 2023-01-15 | Discharge: 2023-01-15 | Disposition: A | Discharge status: Home / Self Care | Payer: No Typology Code available for payment source | Attending: Emergency Medicine | Admitting: Emergency Medicine

## 2023-01-15 DIAGNOSIS — S80862A Insect bite (nonvenomous), left lower leg, initial encounter: Secondary | ICD-10-CM | POA: Diagnosis not present

## 2023-01-15 DIAGNOSIS — E119 Type 2 diabetes mellitus without complications: Secondary | ICD-10-CM | POA: Insufficient documentation

## 2023-01-15 DIAGNOSIS — Z7901 Long term (current) use of anticoagulants: Secondary | ICD-10-CM | POA: Insufficient documentation

## 2023-01-15 DIAGNOSIS — Z7982 Long term (current) use of aspirin: Secondary | ICD-10-CM | POA: Diagnosis not present

## 2023-01-15 DIAGNOSIS — Z7984 Long term (current) use of oral hypoglycemic drugs: Secondary | ICD-10-CM | POA: Diagnosis not present

## 2023-01-15 DIAGNOSIS — W57XXXA Bitten or stung by nonvenomous insect and other nonvenomous arthropods, initial encounter: Secondary | ICD-10-CM | POA: Diagnosis not present

## 2023-01-15 DIAGNOSIS — I10 Essential (primary) hypertension: Secondary | ICD-10-CM | POA: Diagnosis not present

## 2023-01-15 DIAGNOSIS — Z79899 Other long term (current) drug therapy: Secondary | ICD-10-CM | POA: Diagnosis not present

## 2023-01-15 LAB — CBC WITH DIFFERENTIAL/PLATELET
Abs Immature Granulocytes: 0.02 10*3/uL (ref 0.00–0.07)
Basophils Absolute: 0.1 10*3/uL (ref 0.0–0.1)
Basophils Relative: 1 %
Eosinophils Absolute: 0.1 10*3/uL (ref 0.0–0.5)
Eosinophils Relative: 2 %
HCT: 33.4 % — ABNORMAL LOW (ref 39.0–52.0)
Hemoglobin: 10.5 g/dL — ABNORMAL LOW (ref 13.0–17.0)
Immature Granulocytes: 0 %
Lymphocytes Relative: 30 %
Lymphs Abs: 1.7 10*3/uL (ref 0.7–4.0)
MCH: 29.2 pg (ref 26.0–34.0)
MCHC: 31.4 g/dL (ref 30.0–36.0)
MCV: 93 fL (ref 80.0–100.0)
Monocytes Absolute: 0.6 10*3/uL (ref 0.1–1.0)
Monocytes Relative: 11 %
Neutro Abs: 3.2 10*3/uL (ref 1.7–7.7)
Neutrophils Relative %: 56 %
Platelets: 462 10*3/uL — ABNORMAL HIGH (ref 150–400)
RBC: 3.59 MIL/uL — ABNORMAL LOW (ref 4.22–5.81)
RDW: 17.3 % — ABNORMAL HIGH (ref 11.5–15.5)
WBC: 5.8 10*3/uL (ref 4.0–10.5)
nRBC: 0 % (ref 0.0–0.2)

## 2023-01-15 LAB — BASIC METABOLIC PANEL
Anion gap: 4 — ABNORMAL LOW (ref 5–15)
BUN: 19 mg/dL (ref 8–23)
CO2: 27 mmol/L (ref 22–32)
Calcium: 8.9 mg/dL (ref 8.9–10.3)
Chloride: 102 mmol/L (ref 98–111)
Creatinine, Ser: 0.8 mg/dL (ref 0.61–1.24)
GFR, Estimated: >60 mL/min (ref >60)
Glucose, Bld: 162 mg/dL — ABNORMAL HIGH (ref 70–99)
Potassium: 4.2 mmol/L (ref 3.5–5.1)
Sodium: 133 mmol/L — ABNORMAL LOW (ref 135–145)

## 2023-01-15 MED ORDER — DOXYCYCLINE HYCLATE 100 MG PO CAPS: 100.0000 mg | ORAL_CAPSULE | Freq: Two times a day (BID) | ORAL | 0 refills | Status: DC

## 2023-01-15 MED ORDER — SODIUM CHLORIDE 0.9 % IV SOLN
1.0000 g | Freq: Once | INTRAVENOUS | Status: AC
  Administered 2023-01-15: 1 g via INTRAVENOUS
  Filled 2023-01-15: qty 10

## 2023-01-15 NOTE — Discharge Instructions (Signed)
Elevate your leg when possible.  May apply warm wet compresses on and off to the wound.  Keep the area bandaged.  Continue taking your doxycycline.  You have been prescribed 3 more days of the doxycycline for a total of 10 days of antibiotic.  Please follow-up with your primary care provider for recheck.  Return emergency department for any new or worsening symptoms.

## 2023-01-15 NOTE — ED Triage Notes (Signed)
Pt has sore to left leg, states turned black, [facility] and [facility] come [facility].  Pt states it is painful. Took a shower today and it is draining fluid since then.

## 2023-01-15 NOTE — ED Provider Notes (Signed)
EMERGENCY DEPARTMENT AT [facility] Provider Note   CSN: [id number] Arrival date & time: 01/15/23  1547     History  Chief Complaint  Patient presents with   Leg Injury    Curtis Morris is a 82 y.o. male.  HPI      Curtis Morris is a 82 y.o. male with past medical history of hypertension, type 2 diabetes, atrial fibrillation, anticoagulated on apixaban who presents to the Emergency Department complaining of discolored, draining sore to his posterior left lower leg.  States he was here 1 week ago for evaluation of a swollen area of his left groin.  States he was told this was a lymph node.  He followed up with his PCP at the [facility] and was started on doxycycline.  He is continue to have pain from his groin that radiates into his leg.  After showering today he noticed a black appearing lesion to his lower leg.  He states the skin peeled off after his shower and has been draining a clear appearing fluid since then.  He denies any fever or chills.  He feels the pain of his leg has improved since the sore began draining.  He contacted his PCP at the [facility] and was instructed to come to the emergency department for further evaluation.   Home Medications Prior to Admission medications   Medication Sig Start Date End Date Taking? Authorizing Provider  apixaban (ELIQUIS) 5 MG TABS tablet Take 1 tablet (5 mg total) by mouth 2 (two) times daily. 12/02/21   Azucena Fallen, MD  aspirin EC 81 MG tablet Take 81 mg by mouth daily. Swallow whole.    [provider]  ATORVASTATIN CALCIUM PO Take 20 mg by mouth at bedtime.    [provider]  Calcium Carbonate-Vitamin D (CALCIUM-VITAMIN D3 PO) Take 1 tablet by mouth 2 (two) times daily.    [provider]  famotidine (PEPCID) 20 MG tablet Take 1 tablet (20 mg total) by mouth daily. 08/01/22   Jacalyn Lefevre, MD  furosemide (LASIX) 20 MG tablet Take 1 tablet (20 mg total) by mouth daily. 05/01/22   Sabas Sous, MD  lipase/protease/amylase (CREON) 12000 units CPEP capsule Take 12,000 Units by mouth 2 (two) times daily.    [provider]  magnesium oxide (MAG-OX) 400 MG tablet Take 400 mg by mouth daily.    [provider]  metFORMIN (GLUCOPHAGE) 500 MG tablet Take 500 mg by mouth every evening.    [provider]  metoprolol tartrate (LOPRESSOR) 50 MG tablet Take 1 tablet (50 mg total) by mouth 2 (two) times daily. 11/27/21   Azucena Fallen, MD  ondansetron (ZOFRAN ODT) 4 MG disintegrating tablet Take 1 tablet (4 mg total) by mouth every 8 (eight) hours as needed for nausea or vomiting. Patient not taking: Reported on 11/24/2021 08/04/18   Little, Ambrose Finland, MD  ondansetron (ZOFRAN) 8 MG tablet Take 1 tablet (8 mg total) by mouth every 8 (eight) hours as needed for nausea or vomiting. 12/06/21   Al Decant, PA-C  ondansetron (ZOFRAN-ODT) 4 MG disintegrating tablet Take 1 tablet (4 mg total) by mouth every 8 (eight) hours as needed for nausea or vomiting. 05/01/22   Sabas Sous, MD  ondansetron (ZOFRAN-ODT) 4 MG disintegrating tablet Take 1 tablet (4 mg total) by mouth every 8 (eight) hours as needed. 08/01/22   Jacalyn Lefevre, MD  oxyCODONE-acetaminophen (PERCOCET/ROXICET) 5-325 MG tablet Take 1 tablet  by mouth every 6 (six) hours as needed for severe pain. 12/06/21   Al Decant, PA-C  pantoprazole (PROTONIX) 40 MG tablet Take 40 mg by mouth 2 (two) times daily.    [provider]  tamsulosin (FLOMAX) 0.4 MG CAPS capsule Take 0.4 mg by mouth at bedtime.    [provider]      Allergies    Patient has no known allergies.    Review of Systems   Review of Systems  Constitutional:  Negative for appetite change, chills and fever.  Respiratory:  Negative for shortness of breath.   Cardiovascular:  Negative for chest pain.  Gastrointestinal:  Negative for abdominal pain, nausea and vomiting.  Musculoskeletal:  Positive for  myalgias. Negative for arthralgias.  Skin:  Positive for color change and wound.  Neurological:  Negative for weakness, numbness and headaches.    Physical Exam Updated Vital Signs BP 126/67 (BP Location: Right Arm)   Pulse (!) 115   Temp 97.7 F (36.5 C) (Oral)   Resp 18   SpO2 97%  Physical Exam Vitals and nursing note reviewed.  Constitutional:      General: He is not in acute distress.    Appearance: Normal appearance. He is not ill-appearing or toxic-appearing.  Cardiovascular:     Rate and Rhythm: Normal rate and regular rhythm.     Pulses: Normal pulses.  Pulmonary:     Effort: Pulmonary effort is normal.  Abdominal:     Palpations: Abdomen is soft.     Tenderness: There is no abdominal tenderness.  Musculoskeletal:        General: Normal range of motion.  Lymphadenopathy:     Comments: Palpable lymph nodes of left inguinal area.  No erythema, non tender  Skin:    General: Skin is warm.     Capillary Refill: Capillary refill takes less than 2 seconds.     Comments: 1 cm bluish to black appearing lesion to the medial aspect of the right lower leg.  There is some surrounding erythema noted.  Serous drainage noted, no purulent drainage or fluctuance.  No surrounding induration.  I do not appreciate any lymphangitis.  Neurological:     General: No focal deficit present.     Mental Status: He is alert.     Sensory: No sensory deficit.     Motor: No weakness.     ED Results / Procedures / Treatments   Labs (all labs ordered are listed, but only abnormal results are displayed) Labs Reviewed  CBC WITH DIFFERENTIAL/PLATELET - Abnormal; Notable for the following components:      Result Value   RBC 3.59 (*)    Hemoglobin 10.5 (*)    HCT 33.4 (*)    RDW 17.3 (*)    Platelets 462 (*)    All other components within normal limits  BASIC METABOLIC PANEL - Abnormal; Notable for the following components:   Sodium 133 (*)    Glucose, Bld 162 (*)    Anion gap 4 (*)    All  other components within normal limits    EKG None  Radiology No results found.  Procedures Procedures    Medications Ordered in ED Medications - No data to display  ED Course/ Medical Decision Making/ A&P                             Medical Decision Making Pt here for pain, lesion to left  left lower leg.  He was seen here 01/08/23 for swelling of left groin.  Had pelvic US that shows lymphadenopathy on previous visit.  Also seen by PCP at the [facility] for same and currently taking abx.  Here because lesion of the lower leg was de roofed after showering and he was advised to come to ER for further eval.  Pt notes improvement of his pain   Diff dx includes insect bite, skin lesion, cellulitis, abscess  Amount and/or Complexity of Data Reviewed Labs: ordered.    Details: Labs w/o leukocytosis, Hgb baseline Discussion of management or test interpretation with external provider(s): Pt with cellulitis secondary to likely insect bite.  No drainable abscess.  Currently taking doxy but given 7 day supply. Pain and swelling of left groin reported as improving.    I will extend his rx to 10 day course.  He will continue warm soaks.  Tylenol if needed for pain.    Risk Prescription drug management.           Final Clinical Impression(s) / ED Diagnoses Final diagnoses:  Bug bite with infection, initial encounter    Rx / DC Orders ED Discharge Orders     None         Pauline Aus, PA-C 01/16/23 1622    Bethann Berkshire, MD 01/20/23 1116

## 2023-09-19 IMAGING — CT CT ANGIO EXTREM UP*L*
3 of 6 series · 10 of 46 positions shown, 11 images · non-contrast
Comparison: None.

CLINICAL DATA: 81-year-old male with upper arm trauma

EXAM:
CTA OF THE UPPER LEFT EXTREMITY WITHOUT CONTRAST
TECHNIQUE: Multidetector CT imaging of the upper left extremity was performed
according to the standard protocol after administration of standard
contrast bolus.
RADIATION DOSE REDUCTION: This exam was performed according to the
departmental dose-optimization program which includes automated
exposure control, adjustment of the mA and/or kV according to
patient size and/or use of iterative reconstruction technique.

[Series 5: axial arterial · axial · arterial · 0.71mm/px · z∈[+787,+1285]mm · 4 of 278 slices shown, 5 images]
[im 56/278  soft-tissue]
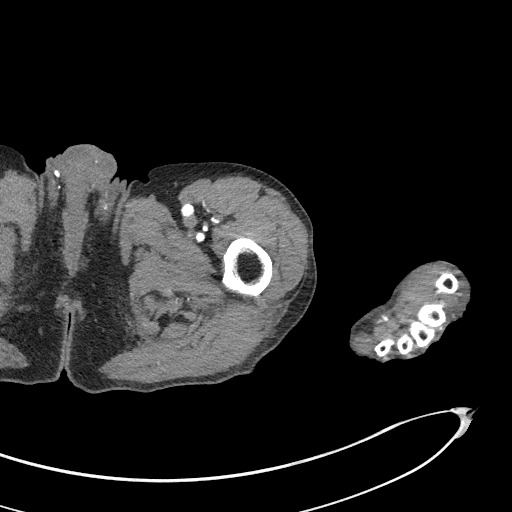
[im 56/278  bone]
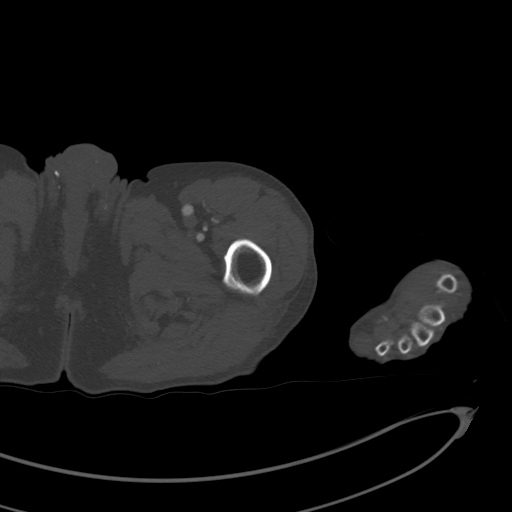
[im 111/278  soft-tissue]
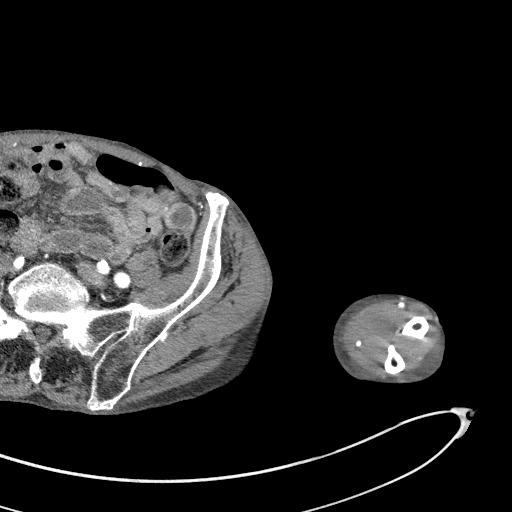
[im 167/278  soft-tissue]
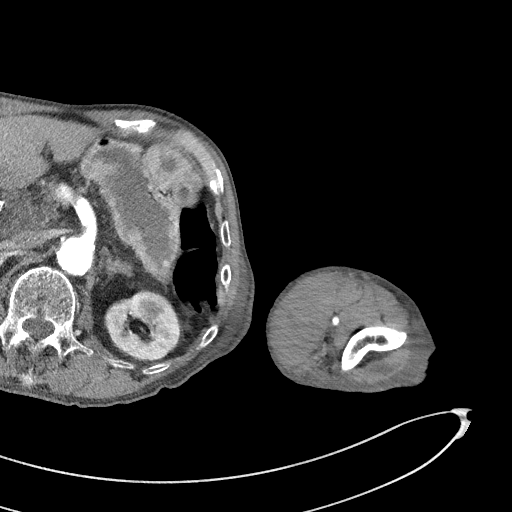
[im 222/278  soft-tissue]
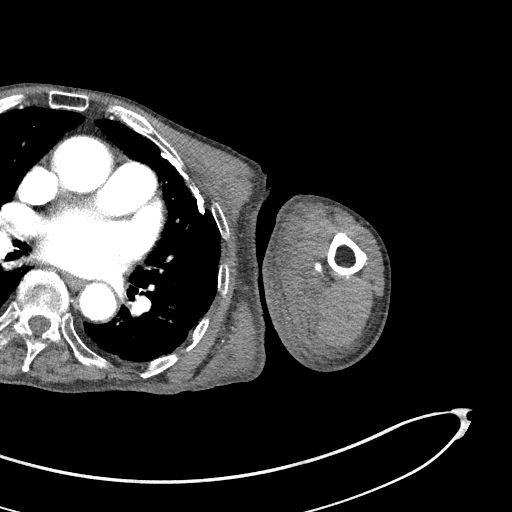

[Series 6: arterial thins · axial · arterial · 0.71mm/px · z∈[+682,+883]mm · 3 of 1290 slices shown]
[im 104/1290  soft-tissue]
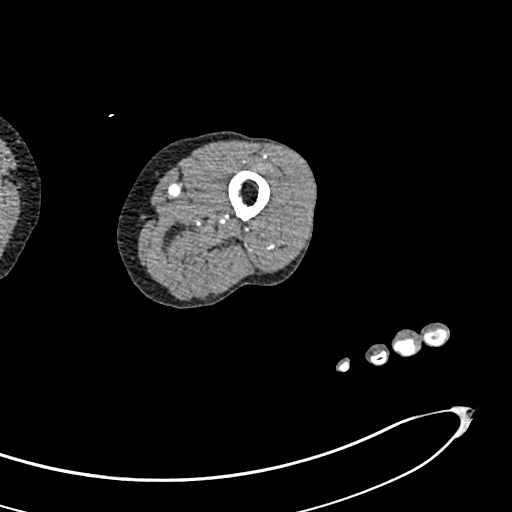
[im 258/1290  soft-tissue]
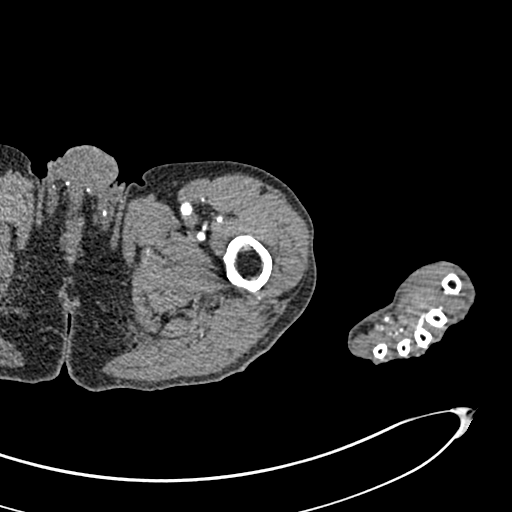
[im 413/1290  soft-tissue]
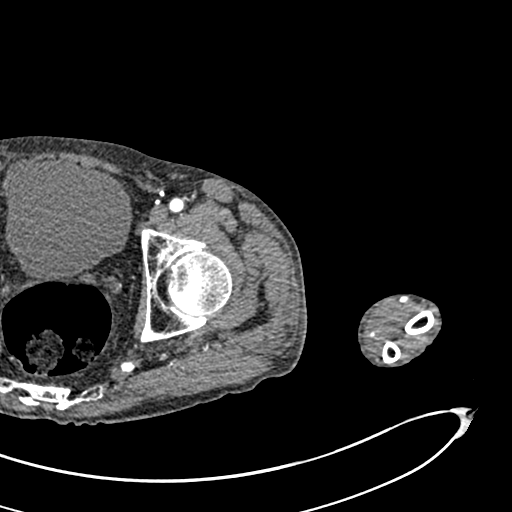

[Series 9: coronals · coronal · 0.52mm/px · 3 of 80 slices shown]
[im 20/80  soft-tissue]
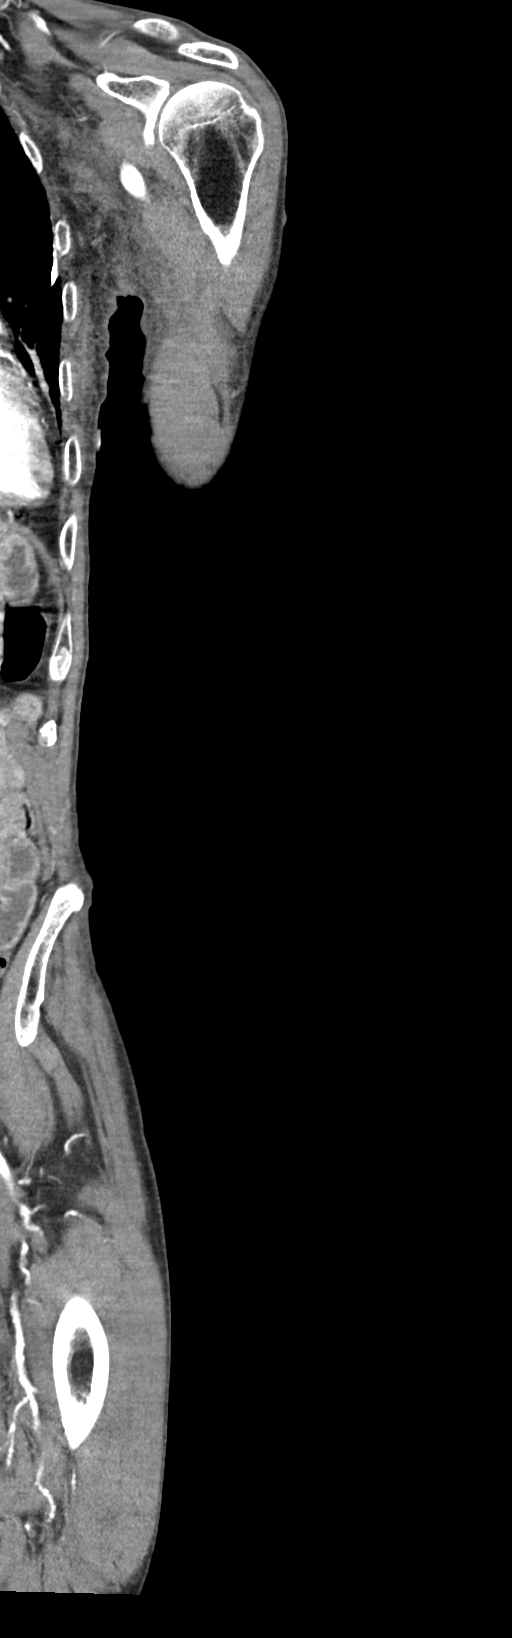
[im 40/80  soft-tissue]
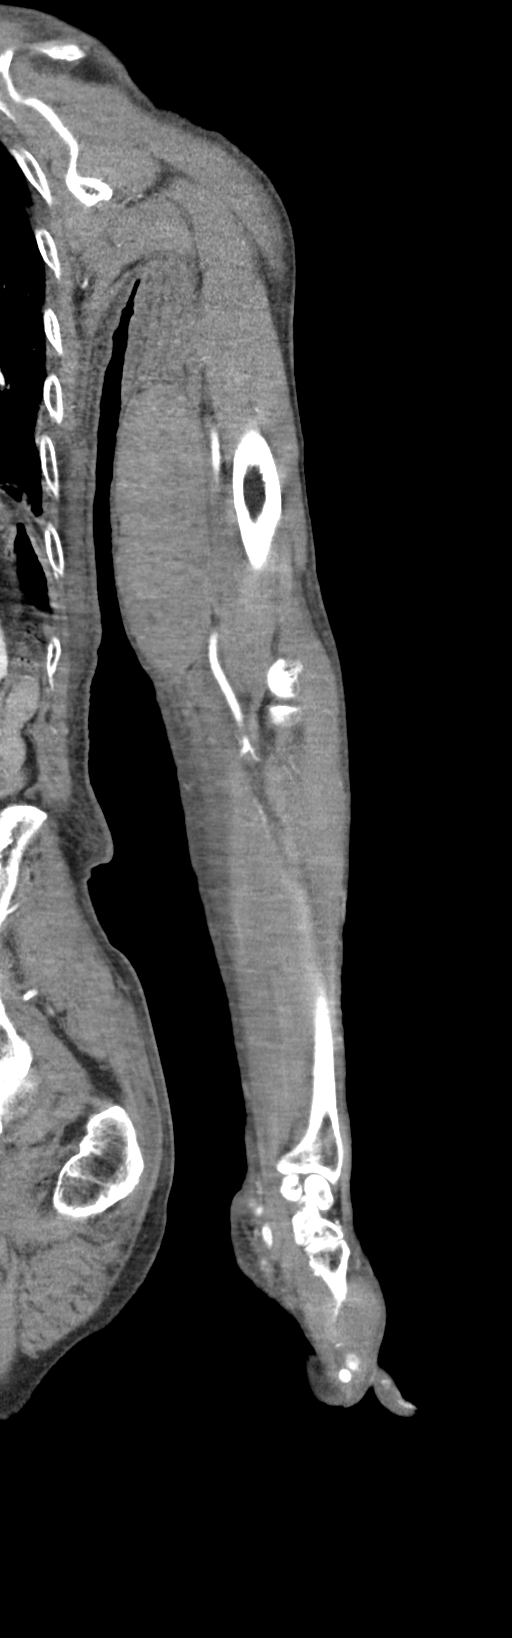
[im 60/80  soft-tissue]
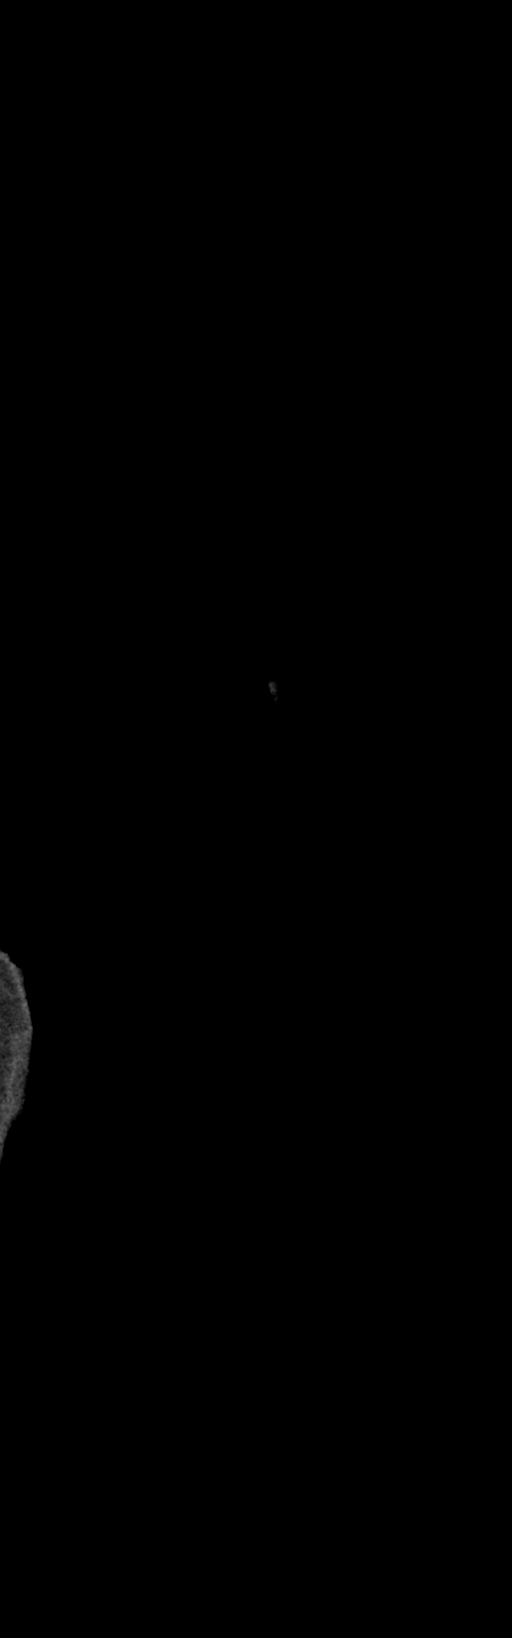

[10 of 46 positions shown; findings below may reference images not displayed]

FINDINGS: Vascular:

Aorta: Minimal aortic calcifications. Normal course caliber and
contour of the visualized thoracic aorta. Mild atherosclerosis of
the distal thoracic aorta and visualized abdominal aorta. Branch
vessels are patent with patency of the cervical cerebral vessels
maintained.

Left upper extremity:

Three vessel arch. Minimal atherosclerosis at the left subclavian
artery origin. Normal course caliber and contour of the subclavian
artery and axillary artery.

Circumflex humeral arteries unremarkable.

Brachial artery patent with no significant atherosclerosis. No
filling defect, significant stenosis. There is no evidence of
extravasation of contrast or pooling of contrast in the upper arm.

Radial artery and ulnar artery are patent from the origin to the
wrist. Calcifications of the distal radial artery and ulnar artery.
Proximal interosseous artery is patent. Small caliber distally.

Musculoskeletal:

Intermediate density fluid collection within the medial left upper
arm, with axial dimensions of 8.7 cm by 4.6 cm and craniocaudal
dimension of 16.5 cm compatible with hematoma within the
musculature. No evidence of extravasation of contrast or puddling.

Secondary fluid collection associated with the elbow on the lateral
arm, of low-density and suspected secondary hematoma.

Incidentally imaged:

Patent celiac artery with atherosclerotic changes at the origin.

Patent SMA with mild atherosclerosis at the origin.

Patent IMA.

Origin of the bilateral renal arteries patent, incompletely
evaluated.

Proximal iliac arteries patent. There is ulcerated plaque of the
right common iliac artery without inflammatory changes, with the
greatest diameter estimated 18 mm.

Visualized proximal left femoral arteries patent.

Cardiomegaly with coronary artery calcifications of left main, left
anterior descending, right coronary arteries.

Nonvascular:

Mediastinum: No adenopathy. Minimal debris within the midthoracic
esophagus.

Lungs/pleura: Left-sided pleural plaques. Trace left-sided pleural
effusion.

Centrilobular and paraseptal emphysema. Respiratory motion somewhat
limits evaluation of the lung bases.

Evidence of prior granulomatous disease of the liver.  Splenectomy.

Chronic pancreatitis.

Unremarkable visualized urinary system.

Surgical changes of the stomach again demonstrated with otherwise
unremarkable GI system.
IMPRESSION: Hematoma of the left upper arm centered within the biceps
musculature, estimated 8.7 cm x 4.6 cm x 16.5 cm. There is no
evidence of active extravasation on the current CT angiogram.

Secondary small hematoma overlying the elbow.

Aortic atherosclerosis, cardiomegaly, and coronary artery disease.
Aortic Atherosclerosis (YZ2KX-OF4.4).

Emphysema (YZ2KX-3O3.M).

Additional ancillary findings as above.

## 2023-09-29 IMAGING — CT CT ABD-PELV W/ CM
2 of 5 series · 16 of 46 positions shown, 18 images · IV contrast (Omnipaque or Isovue)
Comparison: August 04, 2018

CLINICAL DATA: Abdominal pain with nausea and vomiting.

EXAM:
CT ABDOMEN AND PELVIS WITH CONTRAST
TECHNIQUE: Multidetector CT imaging of the abdomen and pelvis was performed
using the standard protocol following bolus administration of
intravenous contrast.

[Series 2: axial st · axial · 0.71mm/px · z∈[+846,+1246]mm · 13 of 90 slices shown, 15 images]
[im 5/90  soft-tissue]
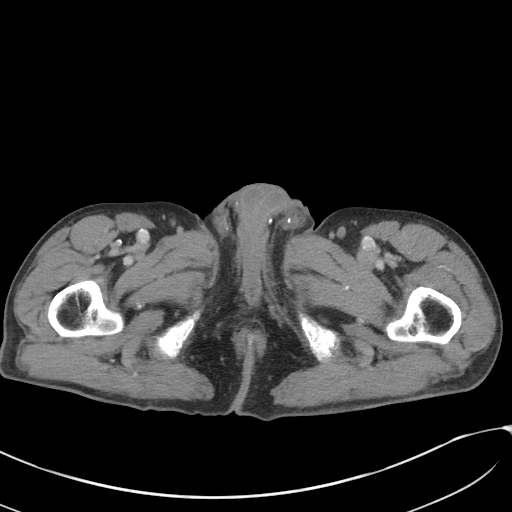
[im 5/90  bone]
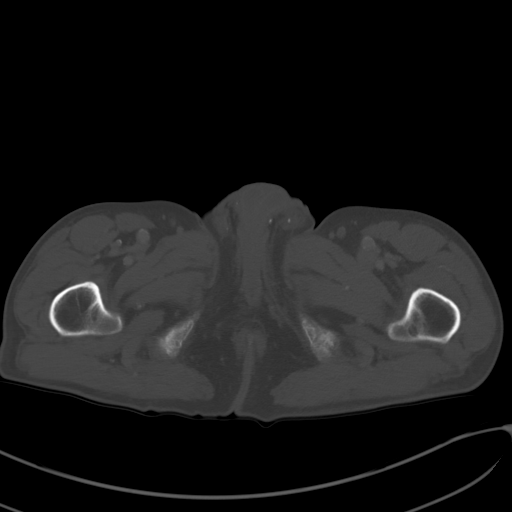
[im 10/90  soft-tissue]
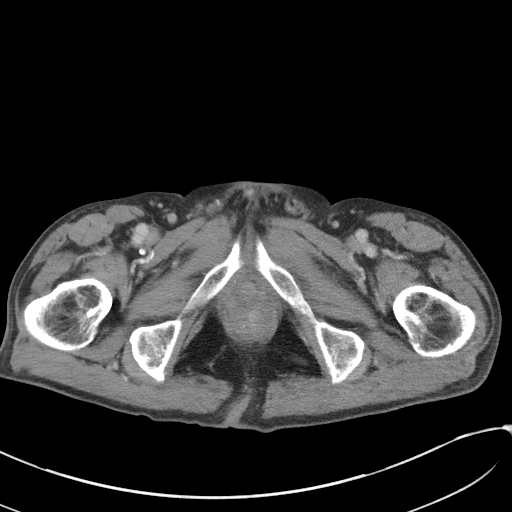
[im 20/90  soft-tissue]
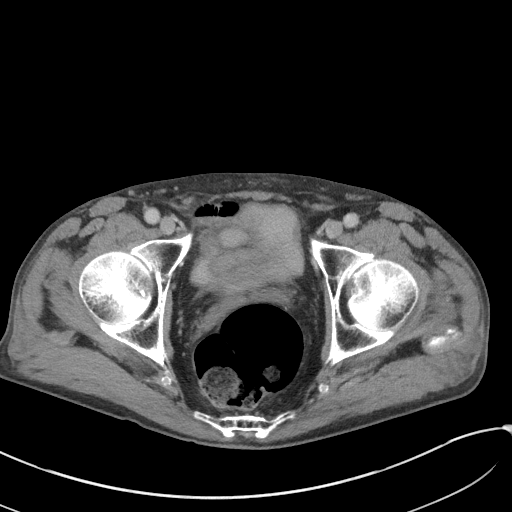
[im 25/90  soft-tissue]
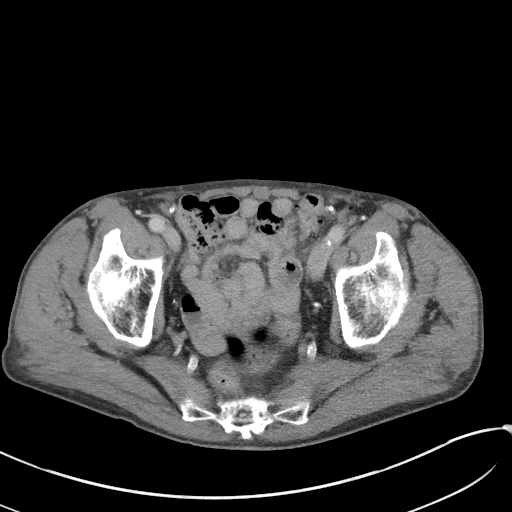
[im 30/90  soft-tissue]
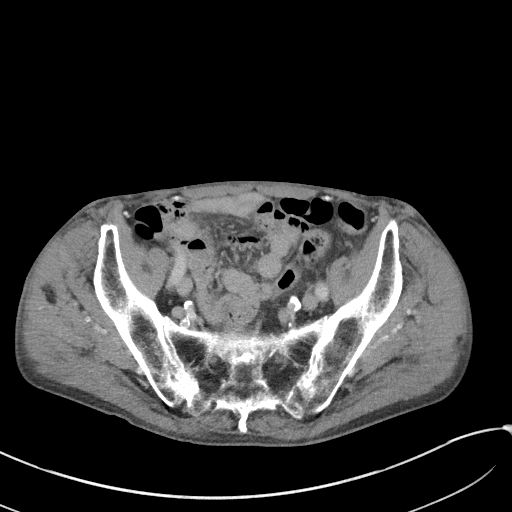
[im 40/90  soft-tissue]
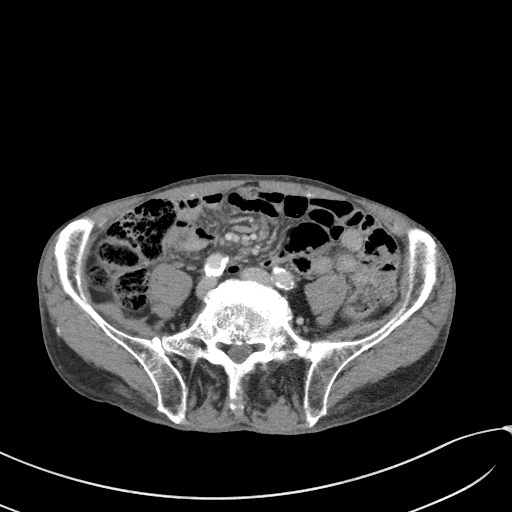
[im 45/90  soft-tissue]
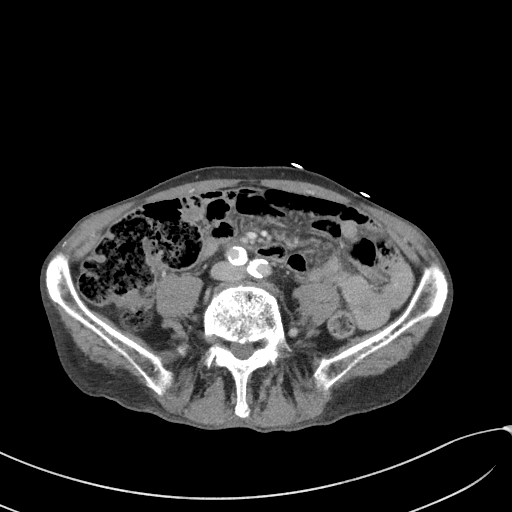
[im 50/90  soft-tissue]
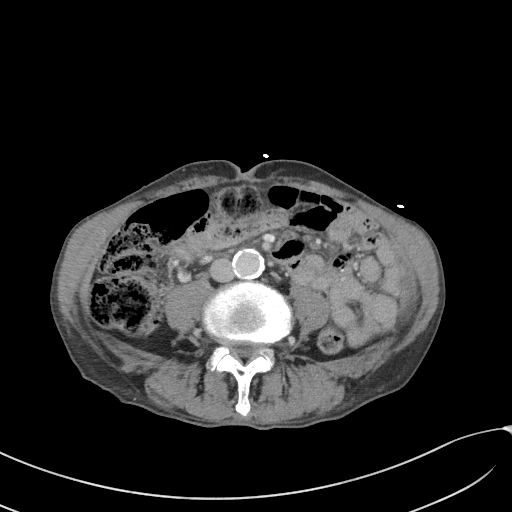
[im 60/90  soft-tissue]
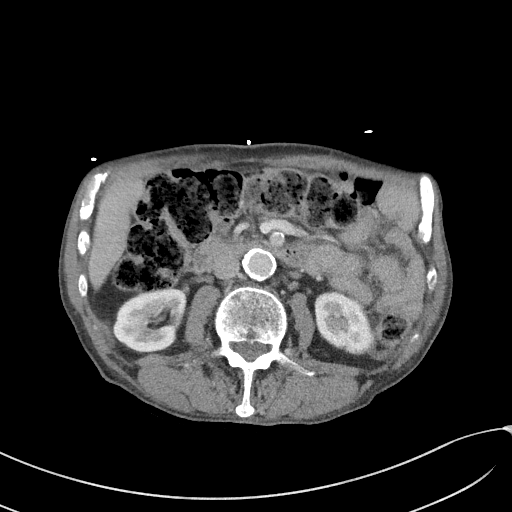
[im 60/90  bone]
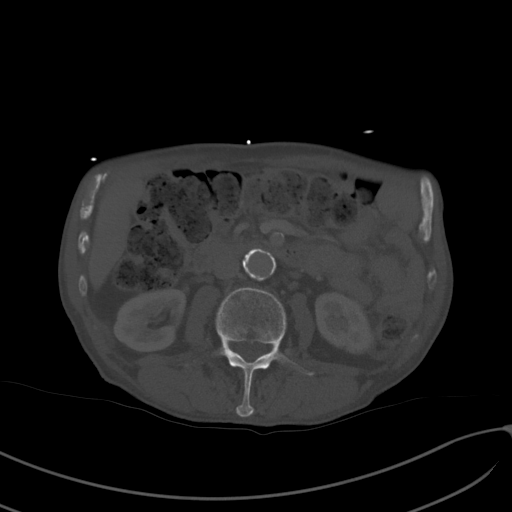
[im 65/90  soft-tissue]
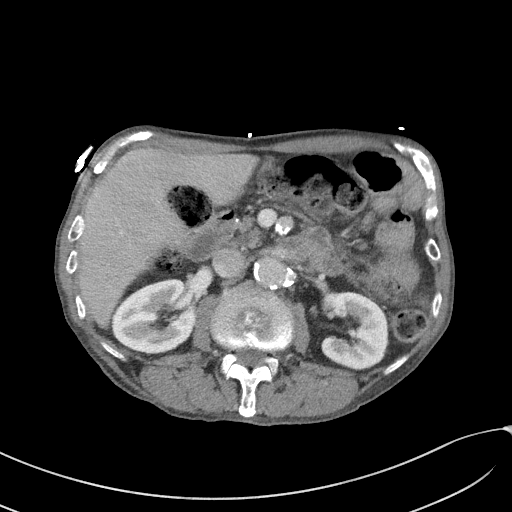
[im 70/90  soft-tissue]
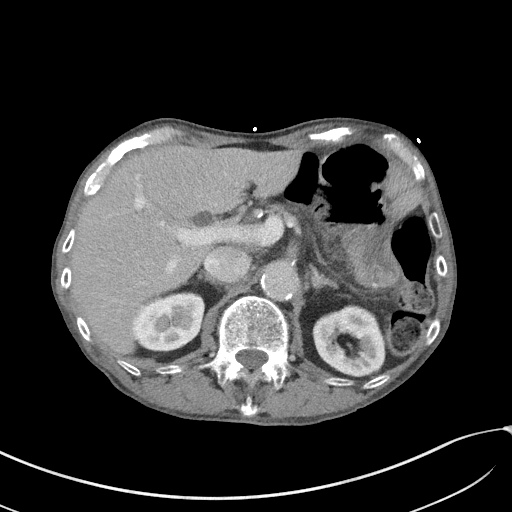
[im 80/90  soft-tissue]
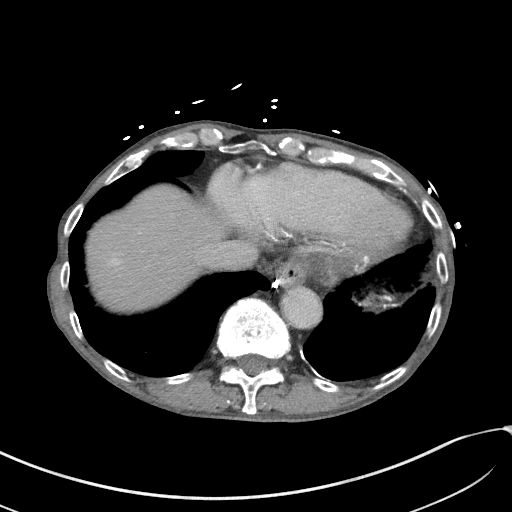
[im 85/90  soft-tissue]
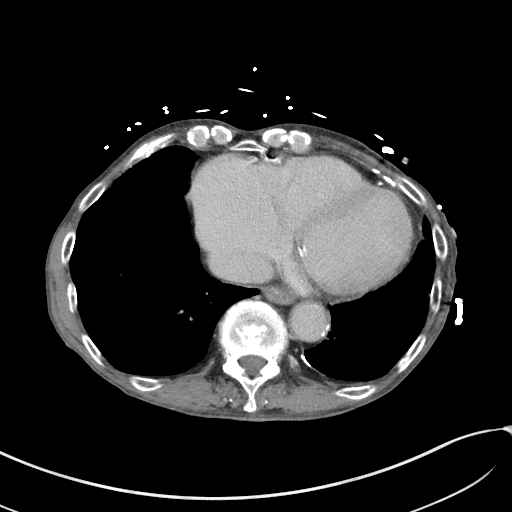

[Series 5: coronal st · coronal · 0.79mm/px · 3 of 90 slices shown]
[im 30/90  soft-tissue]
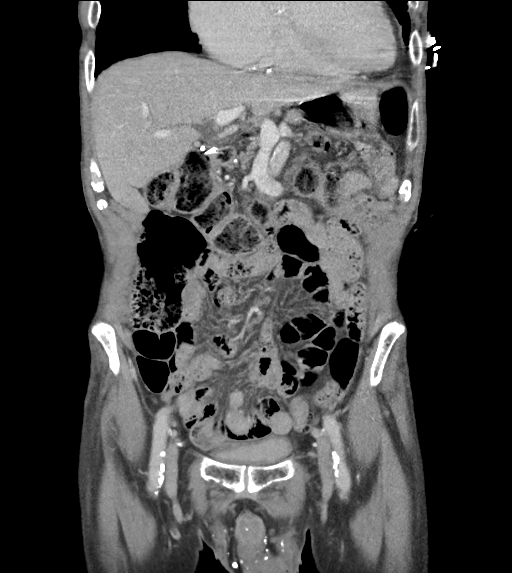
[im 40/90  soft-tissue]
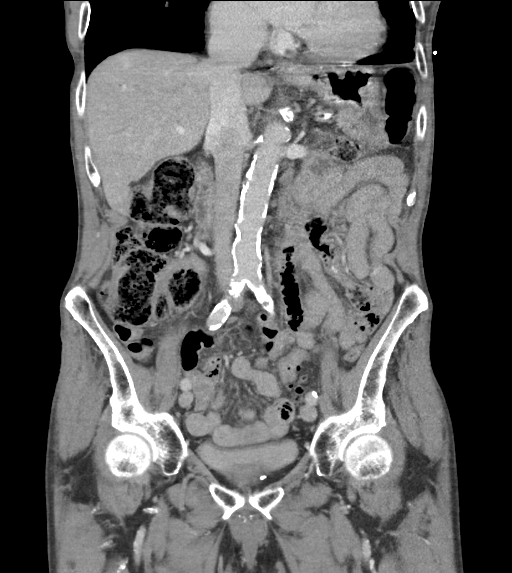
[im 50/90  soft-tissue]
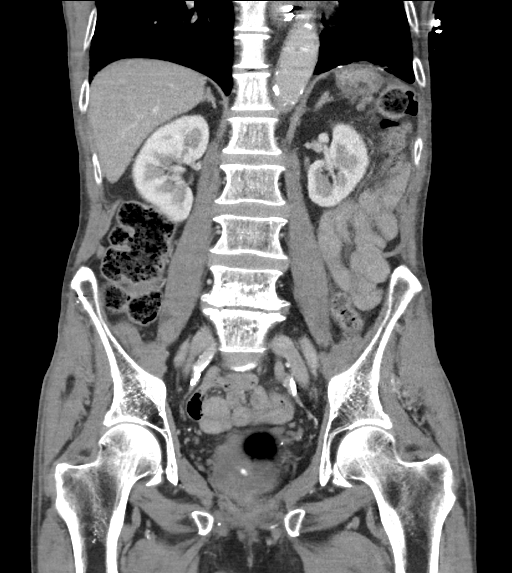

[16 of 46 positions shown; findings below may reference images not displayed]

RADIATION DOSE REDUCTION: This exam was performed according to the
departmental dose-optimization program which includes automated
exposure control, adjustment of the mA and/or kV according to
patient size and/or use of iterative reconstruction technique.

CONTRAST:  100mL OMNIPAQUE IOHEXOL 300 MG/ML  SOLN
FINDINGS: Lower chest: Stable cardiomegaly is seen.

No acute abnormalities are identified.

Hepatobiliary: No focal liver abnormality is seen. Status post
cholecystectomy. No biliary dilatation.

Pancreas: Numerous parenchymal calcifications are seen within the
pancreatic body and tail. No pancreatic ductal dilatation or
surrounding inflammatory changes.

Spleen: The spleen is surgically absent.

Adrenals/Urinary Tract: Adrenal glands are unremarkable. Kidneys are
normal in size, without obstructing renal calculi or hydronephrosis.
10 mm and 6 mm cysts are seen within the right kidney. No additional
follow-up for imaging is recommended. Mild cortical scarring is seen
along the posterior aspect of the left kidney. A 2 mm renal calculus
is seen within the right kidney. Contrast is seen throughout the
lumen of a poorly distended urinary bladder.

Stomach/Bowel: There is a small hiatal hernia. Metallic density
surgical clips are seen surrounding the gastroesophageal junction.
The appendix is surgically absent. Stool is seen throughout the
large bowel. No evidence of bowel dilatation. Noninflamed
diverticula are seen within the sigmoid colon.

An area of mild mesenteric twisting is seen along the midline of the
lower abdomen and pelvis (axial CT images 44 through 62, CT series
2). This is not seen on the prior study.

Vascular/Lymphatic: Aortic atherosclerosis. No enlarged abdominal or
pelvic lymph nodes.

Reproductive: The prostate gland is moderately enlarged and
heterogeneous in appearance. Prostate gland calcification is also
seen.

Other: No abdominal wall hernia or abnormality. No abdominopelvic
ascites.

Musculoskeletal: Multilevel degenerative changes seen throughout the
lumbar spine. This is most prominent at the level of L4-L5.
IMPRESSION: 1. Evidence of prior cholecystectomy and splenectomy.
2. Findings consistent with chronic pancreatitis.
3. Sigmoid diverticulosis.
4. Small hiatal hernia.
5. Mild degree of mesenteric twisting along the midline of the lower
abdomen and pelvis, without evidence of bowel obstruction.

Aortic Atherosclerosis (ML4SL-ABY.Y).

## 2023-09-30 ENCOUNTER — Encounter (HOSPITAL_COMMUNITY): Payer: Self-pay

## 2023-09-30 ENCOUNTER — Emergency Department (HOSPITAL_COMMUNITY): Payer: No Typology Code available for payment source

## 2023-09-30 ENCOUNTER — Other Ambulatory Visit: Payer: Self-pay

## 2023-09-30 ENCOUNTER — Emergency Department (HOSPITAL_COMMUNITY)
Admission: EM | Admit: 2023-09-30 | Discharge: 2023-09-30 | Disposition: A | Discharge status: Home / Self Care | Payer: No Typology Code available for payment source | Attending: Emergency Medicine | Admitting: Emergency Medicine

## 2023-09-30 DIAGNOSIS — J168 Pneumonia due to other specified infectious organisms: Secondary | ICD-10-CM | POA: Insufficient documentation

## 2023-09-30 DIAGNOSIS — J181 Lobar pneumonia, unspecified organism: Secondary | ICD-10-CM | POA: Insufficient documentation

## 2023-09-30 DIAGNOSIS — Z7982 Long term (current) use of aspirin: Secondary | ICD-10-CM | POA: Insufficient documentation

## 2023-09-30 DIAGNOSIS — R5383 Other fatigue: Secondary | ICD-10-CM | POA: Diagnosis present

## 2023-09-30 DIAGNOSIS — J189 Pneumonia, unspecified organism: Secondary | ICD-10-CM

## 2023-09-30 LAB — CBC WITH DIFFERENTIAL/PLATELET
Abs Immature Granulocytes: 0.02 10*3/uL (ref 0.00–0.07)
Basophils Absolute: 0 10*3/uL (ref 0.0–0.1)
Basophils Relative: 0 %
Eosinophils Absolute: 0 10*3/uL (ref 0.0–0.5)
Eosinophils Relative: 0 %
HCT: 37.4 % — ABNORMAL LOW (ref 39.0–52.0)
Hemoglobin: 12.4 g/dL — ABNORMAL LOW (ref 13.0–17.0)
Immature Granulocytes: 0 %
Lymphocytes Relative: 16 %
Lymphs Abs: 1.1 10*3/uL (ref 0.7–4.0)
MCH: 31.1 pg (ref 26.0–34.0)
MCHC: 33.2 g/dL (ref 30.0–36.0)
MCV: 93.7 fL (ref 80.0–100.0)
Monocytes Absolute: 0.6 10*3/uL (ref 0.1–1.0)
Monocytes Relative: 8 %
Neutro Abs: 5 10*3/uL (ref 1.7–7.7)
Neutrophils Relative %: 76 %
Platelets: 468 10*3/uL — ABNORMAL HIGH (ref 150–400)
RBC: 3.99 MIL/uL — ABNORMAL LOW (ref 4.22–5.81)
RDW: 16.1 % — ABNORMAL HIGH (ref 11.5–15.5)
WBC: 6.7 10*3/uL (ref 4.0–10.5)
nRBC: 0 % (ref 0.0–0.2)

## 2023-09-30 LAB — COMPREHENSIVE METABOLIC PANEL
ALT: 21 U/L (ref 0–44)
AST: 19 U/L (ref 15–41)
Albumin: 3.7 g/dL (ref 3.5–5.0)
Alkaline Phosphatase: 65 U/L (ref 38–126)
Anion gap: 11 (ref 5–15)
BUN: 17 mg/dL (ref 8–23)
CO2: 23 mmol/L (ref 22–32)
Calcium: 9.4 mg/dL (ref 8.9–10.3)
Chloride: 101 mmol/L (ref 98–111)
Creatinine, Ser: 0.83 mg/dL (ref 0.61–1.24)
GFR, Estimated: >60 mL/min (ref >60)
Glucose, Bld: 241 mg/dL — ABNORMAL HIGH (ref 70–99)
Potassium: 3.5 mmol/L (ref 3.5–5.1)
Sodium: 135 mmol/L (ref 135–145)
Total Bilirubin: 0.8 mg/dL (ref 0.0–1.2)
Total Protein: 7.2 g/dL (ref 6.5–8.1)

## 2023-09-30 LAB — LACTIC ACID, PLASMA: Lactic Acid, Venous: 1.1 mmol/L (ref 0.5–1.9)

## 2023-09-30 LAB — TROPONIN I (HIGH SENSITIVITY): Troponin I (High Sensitivity): 5 ng/L (ref <18)

## 2023-09-30 LAB — BRAIN NATRIURETIC PEPTIDE: B Natriuretic Peptide: 422 pg/mL — ABNORMAL HIGH (ref 0.0–100.0)

## 2023-09-30 LAB — SARS CORONAVIRUS 2 BY RT PCR: SARS Coronavirus 2 by RT PCR: NEGATIVE

## 2023-09-30 MED ORDER — SODIUM CHLORIDE 0.9 % IV BOLUS
500.0000 mL | Freq: Once | INTRAVENOUS | Status: AC
  Administered 2023-09-30: 500 mL via INTRAVENOUS

## 2023-09-30 MED ORDER — SODIUM CHLORIDE 0.9 % IV SOLN
1.0000 g | Freq: Once | INTRAVENOUS | Status: AC
  Administered 2023-09-30: 1 g via INTRAVENOUS
  Filled 2023-09-30: qty 10

## 2023-09-30 MED ORDER — DOXYCYCLINE HYCLATE 100 MG PO TABS
100.0000 mg | ORAL_TABLET | Freq: Once | ORAL | Status: AC
  Administered 2023-09-30: 100 mg via ORAL
  Filled 2023-09-30: qty 1

## 2023-09-30 MED ORDER — BENZONATATE 100 MG PO CAPS: 100.0000 mg | ORAL_CAPSULE | Freq: Three times a day (TID) | ORAL | 0 refills | Status: AC

## 2023-09-30 MED ORDER — ALBUTEROL SULFATE HFA 108 (90 BASE) MCG/ACT IN AERS: 1.0000 | INHALATION_SPRAY | Freq: Four times a day (QID) | RESPIRATORY_TRACT | 0 refills | Status: AC | PRN

## 2023-09-30 MED ORDER — SODIUM CHLORIDE 0.9 % IV BOLUS: 500.0000 mL | Freq: Once | INTRAVENOUS | Status: DC

## 2023-09-30 MED ORDER — DOXYCYCLINE HYCLATE 100 MG PO CAPS: 100.0000 mg | ORAL_CAPSULE | Freq: Two times a day (BID) | ORAL | 0 refills | Status: AC

## 2023-09-30 MED ORDER — IPRATROPIUM-ALBUTEROL 0.5-2.5 (3) MG/3ML IN SOLN
3.0000 mL | Freq: Once | RESPIRATORY_TRACT | Status: AC
  Administered 2023-09-30: 3 mL via RESPIRATORY_TRACT
  Filled 2023-09-30: qty 3

## 2023-09-30 NOTE — Discharge Instructions (Signed)
 Please follow-up closely with your primary care doctor on an outpatient basis.  Take all antibiotics as directed.  Return to emergency department immediately for any new or worsening symptoms.

## 2023-09-30 NOTE — ED Triage Notes (Signed)
 Pt states his wife had chest infection last week and now he has it. C/o nausea last few days. Headache, says he can't eat and is feeling weak. Hx of A.Fib., diabetes, HTN, glaucoma, and takes eliquis daily. Wife stopped his glaucoma eye drps yesterday as she states he was "seeing things".

## 2023-09-30 NOTE — ED Provider Notes (Signed)
 [place] EMERGENCY DEPARTMENT AT [facility] Provider Note   CSN: [id number] Arrival date & time: 09/30/23  3086     History  Chief Complaint  Patient presents with   Weakness   Nausea    Curtis Morris is a 83 y.o. male.  Patient is an 83 year old male who presents emergency department with a chief complaint of generalized malaise and fatigue, lack of appetite, productive cough which has been ongoing for approximate the past week.  He notes his cough has been productive of yellow sputum.  He was exposed to his wife who was sick with similar symptoms.  Patient notes that he has had no associated nausea, vomiting, diarrhea.  He denies any chest pain but does admit to some shortness of breath.  He denies any associated dizziness, lightheadedness or syncope.  He has had no numbness, paresthesias or weakness.  He notes that his cough has been productive of yellow sputum but denies any associated hemoptysis.   Weakness Associated symptoms: cough        Home Medications Prior to Admission medications   Medication Sig Start Date End Date Taking? Authorizing Provider  apixaban (ELIQUIS) 5 MG TABS tablet Take 1 tablet (5 mg total) by mouth 2 (two) times daily. 12/02/21   Azucena Fallen, MD  aspirin EC 81 MG tablet Take 81 mg by mouth daily. Swallow whole.    [provider]  ATORVASTATIN CALCIUM PO Take 20 mg by mouth at bedtime.    [provider]  Calcium Carbonate-Vitamin D (CALCIUM-VITAMIN D3 PO) Take 1 tablet by mouth 2 (two) times daily.    [provider]  doxycycline (VIBRAMYCIN) 100 MG capsule Take 1 capsule (100 mg total) by mouth 2 (two) times daily. 01/15/23   Triplett, Tammy, PA-C  famotidine (PEPCID) 20 MG tablet Take 1 tablet (20 mg total) by mouth daily. 08/01/22   Jacalyn Lefevre, MD  furosemide (LASIX) 20 MG tablet Take 1 tablet (20 mg total) by mouth daily. 05/01/22   Sabas Sous, MD  lipase/protease/amylase (CREON) 12000  units CPEP capsule Take 12,000 Units by mouth 2 (two) times daily.    [provider]  magnesium oxide (MAG-OX) 400 MG tablet Take 400 mg by mouth daily.    [provider]  metFORMIN (GLUCOPHAGE) 500 MG tablet Take 500 mg by mouth every evening.    [provider]  metoprolol tartrate (LOPRESSOR) 50 MG tablet Take 1 tablet (50 mg total) by mouth 2 (two) times daily. 11/27/21   Azucena Fallen, MD  ondansetron (ZOFRAN ODT) 4 MG disintegrating tablet Take 1 tablet (4 mg total) by mouth every 8 (eight) hours as needed for nausea or vomiting. Patient not taking: Reported on 11/24/2021 08/04/18   Little, Ambrose Finland, MD  ondansetron (ZOFRAN) 8 MG tablet Take 1 tablet (8 mg total) by mouth every 8 (eight) hours as needed for nausea or vomiting. 12/06/21   Al Decant, PA-C  ondansetron (ZOFRAN-ODT) 4 MG disintegrating tablet Take 1 tablet (4 mg total) by mouth every 8 (eight) hours as needed for nausea or vomiting. 05/01/22   Sabas Sous, MD  ondansetron (ZOFRAN-ODT) 4 MG disintegrating tablet Take 1 tablet (4 mg total) by mouth every 8 (eight) hours as needed. 08/01/22   Jacalyn Lefevre, MD  oxyCODONE-acetaminophen (PERCOCET/ROXICET) 5-325 MG tablet Take 1 tablet by mouth every 6 (six) hours as needed for severe pain. 12/06/21   Al Decant, PA-C  pantoprazole (PROTONIX) 40 MG tablet Take  40 mg by mouth 2 (two) times daily.    [provider]  tamsulosin (FLOMAX) 0.4 MG CAPS capsule Take 0.4 mg by mouth at bedtime.    [provider]      Allergies    Patient has no known allergies.    Review of Systems   Review of Systems  Respiratory:  Positive for cough.   Neurological:  Positive for weakness.    Physical Exam Updated Vital Signs BP 137/82   Pulse 99   Temp (!) 97.5 F (36.4 C) (Oral)   Resp 19   Ht 5\' 11"  (1.803 m)   Wt 55.8 kg   SpO2 95%   BMI 17.16 kg/m  Physical Exam Vitals reviewed.  Constitutional:       Appearance: Normal appearance.  HENT:     Head: Normocephalic and atraumatic.     Nose: Nose normal.     Mouth/Throat:     Mouth: Mucous membranes are moist.  Eyes:     Extraocular Movements: Extraocular movements intact.     Conjunctiva/sclera: Conjunctivae normal.     Pupils: Pupils are equal, round, and reactive to light.  Cardiovascular:     Rate and Rhythm: Normal rate and regular rhythm.     Pulses: Normal pulses.     Heart sounds: Normal heart sounds.  Pulmonary:     Effort: Pulmonary effort is normal. No respiratory distress.     Breath sounds: Normal breath sounds.     Comments: Scattered rhonchi and wheezing, Rales noted to right lung base Abdominal:     General: Abdomen is flat. Bowel sounds are normal. There is no distension.     Palpations: Abdomen is soft. There is no mass.     Tenderness: There is no abdominal tenderness.  Musculoskeletal:        General: Normal range of motion.     Cervical back: Normal range of motion and neck supple.     Right lower leg: No edema.     Left lower leg: No edema.  Skin:    General: Skin is warm and dry.     Findings: No rash.  Neurological:     General: No focal deficit present.     Mental Status: He is alert and oriented to person, place, and time. Mental status is at baseline.     Sensory: No sensory deficit.     Motor: No weakness.     Coordination: Coordination normal.     Gait: Gait normal.  Psychiatric:        Mood and Affect: Mood normal.        Behavior: Behavior normal.        Thought Content: Thought content normal.        Judgment: Judgment normal.     ED Results / Procedures / Treatments   Labs (all labs ordered are listed, but only abnormal results are displayed) Labs Reviewed  SARS CORONAVIRUS 2 BY RT PCR  CULTURE, BLOOD (ROUTINE X 2)  CULTURE, BLOOD (ROUTINE X 2)  COMPREHENSIVE METABOLIC PANEL  CBC WITH DIFFERENTIAL/PLATELET  BRAIN NATRIURETIC PEPTIDE  LACTIC ACID, PLASMA  LACTIC ACID, PLASMA   TROPONIN I (HIGH SENSITIVITY)    EKG None  Radiology No results found.  Procedures Procedures    Medications Ordered in ED Medications  ipratropium-albuterol (DUONEB) 0.5-2.5 (3) MG/3ML nebulizer solution 3 mL (has no administration in time range)  sodium chloride 0.9 % bolus 500 mL (has no administration in time range)  ED Course/ Medical Decision Making/ A&P                                 Medical Decision Making Amount and/or Complexity of Data Reviewed Labs: ordered. Radiology: ordered.  Risk Prescription drug management.   This patient presents to the ED for concern of productive cough, shortness of breath differential diagnosis includes acute viral syndrome, pneumonia, acute CHF, ACS, sepsis    Additional history obtained:  Additional history obtained from medical records External records from outside source obtained and reviewed including none   Lab Tests:  I Ordered, and personally interpreted labs.  The pertinent results include: No leukocytosis, no changes in liver function, kidney function, electrolytes, BNP at baseline   Imaging Studies ordered:  I ordered imaging studies including chest x-ray I independently visualized and interpreted imaging which showed no acute consolidation I agree with the radiologist interpretation   Medicines ordered and prescription drug management:  I ordered medication including doxycycline, Rocephin for pneumonia Reevaluation of the patient after these medicines showed that the patient improved I have reviewed the patients home medicines and have made adjustments as needed   Problem List / ED Course:  Patient is doing well at this time and is stable for discharge home.  On auscultation of the airway patient does have rhonchi and rales to the right lung base and suspect that he is suffering from pneumonia at this point.  Will treat him accordingly on an outpatient basis at this time.  He is otherwise low risk  via his curb 65 score and do not suspect that admission is warranted at this time.  Patient had stable blood work.  Vital signs are stable with no indication for sepsis.  Ambulatory pulse ox did not drop below 94% on room air.  Patient notes he does feel much better after treatments in the emergency department.  Close follow-up with primary care doctor was discussed as well as strict return precautions for any new or worsening symptoms.  Patient voiced understanding and had no additional questions.   Social Determinants of Health:  None           Final Clinical Impression(s) / ED Diagnoses Final diagnoses:  None    Rx / DC Orders ED Discharge Orders     None         Kathlen Mody 09/30/23 1102    Benjiman Core, MD 09/30/23 [phone]

## 2023-10-05 LAB — CULTURE, BLOOD (ROUTINE X 2)
Special Requests: ADEQUATE
Special Requests: ADEQUATE

## 2023-12-20 ENCOUNTER — Emergency Department (HOSPITAL_COMMUNITY): Admission: EM | Admit: 2023-12-20 | Discharge: 2023-12-20 | Discharge status: Against Medical Advice

## 2023-12-20 NOTE — ED Notes (Signed)
Pt called a 3rd time no response.

## 2023-12-20 NOTE — ED Notes (Signed)
 Pt called for triage no answer

## 2023-12-20 NOTE — ED Notes (Signed)
Pt called for triage x2 with no response.  

## 2024-03-13 ENCOUNTER — Encounter (HOSPITAL_COMMUNITY): Payer: Self-pay | Admitting: Emergency Medicine

## 2024-03-13 ENCOUNTER — Emergency Department (HOSPITAL_COMMUNITY)
Admission: EM | Admit: 2024-03-13 | Discharge: 2024-03-14 | Disposition: A | Discharge status: Home / Self Care | Attending: Emergency Medicine | Admitting: Emergency Medicine

## 2024-03-13 ENCOUNTER — Other Ambulatory Visit: Payer: Self-pay

## 2024-03-13 DIAGNOSIS — E1165 Type 2 diabetes mellitus with hyperglycemia: Secondary | ICD-10-CM | POA: Insufficient documentation

## 2024-03-13 DIAGNOSIS — Z79899 Other long term (current) drug therapy: Secondary | ICD-10-CM | POA: Diagnosis not present

## 2024-03-13 DIAGNOSIS — R739 Hyperglycemia, unspecified: Secondary | ICD-10-CM | POA: Diagnosis present

## 2024-03-13 DIAGNOSIS — I10 Essential (primary) hypertension: Secondary | ICD-10-CM | POA: Insufficient documentation

## 2024-03-13 DIAGNOSIS — Z7984 Long term (current) use of oral hypoglycemic drugs: Secondary | ICD-10-CM | POA: Insufficient documentation

## 2024-03-13 DIAGNOSIS — Z7982 Long term (current) use of aspirin: Secondary | ICD-10-CM | POA: Insufficient documentation

## 2024-03-13 DIAGNOSIS — Z7901 Long term (current) use of anticoagulants: Secondary | ICD-10-CM | POA: Diagnosis not present

## 2024-03-13 LAB — CBC WITH DIFFERENTIAL/PLATELET
Abs Immature Granulocytes: 0.01 K/uL (ref 0.00–0.07)
Basophils Absolute: 0 K/uL (ref 0.0–0.1)
Basophils Relative: 1 %
Eosinophils Absolute: 0.2 K/uL (ref 0.0–0.5)
Eosinophils Relative: 3 %
HCT: 34.8 % — ABNORMAL LOW (ref 39.0–52.0)
Hemoglobin: 11.3 g/dL — ABNORMAL LOW (ref 13.0–17.0)
Immature Granulocytes: 0 %
Lymphocytes Relative: 32 %
Lymphs Abs: 1.9 K/uL (ref 0.7–4.0)
MCH: 30.6 pg (ref 26.0–34.0)
MCHC: 32.5 g/dL (ref 30.0–36.0)
MCV: 94.3 fL (ref 80.0–100.0)
Monocytes Absolute: 0.6 K/uL (ref 0.1–1.0)
Monocytes Relative: 11 %
Neutro Abs: 3.1 K/uL (ref 1.7–7.7)
Neutrophils Relative %: 53 %
Platelets: 293 K/uL (ref 150–400)
RBC: 3.69 MIL/uL — ABNORMAL LOW (ref 4.22–5.81)
RDW: 18.6 % — ABNORMAL HIGH (ref 11.5–15.5)
WBC: 5.8 K/uL (ref 4.0–10.5)
nRBC: 0 % (ref 0.0–0.2)

## 2024-03-13 LAB — COMPREHENSIVE METABOLIC PANEL WITH GFR
ALT: 37 U/L (ref 0–44)
AST: 25 U/L (ref 15–41)
Albumin: 3.4 g/dL — ABNORMAL LOW (ref 3.5–5.0)
Alkaline Phosphatase: 49 U/L (ref 38–126)
Anion gap: 8 (ref 5–15)
BUN: 26 mg/dL — ABNORMAL HIGH (ref 8–23)
CO2: 24 mmol/L (ref 22–32)
Calcium: 9 mg/dL (ref 8.9–10.3)
Chloride: 102 mmol/L (ref 98–111)
Creatinine, Ser: 0.92 mg/dL (ref 0.61–1.24)
GFR, Estimated: >60 mL/min (ref >60)
Glucose, Bld: 204 mg/dL — ABNORMAL HIGH (ref 70–99)
Potassium: 4.5 mmol/L (ref 3.5–5.1)
Sodium: 134 mmol/L — ABNORMAL LOW (ref 135–145)
Total Bilirubin: 0.7 mg/dL (ref 0.0–1.2)
Total Protein: 6.4 g/dL — ABNORMAL LOW (ref 6.5–8.1)

## 2024-03-13 LAB — URINALYSIS, W/ REFLEX TO CULTURE (INFECTION SUSPECTED)
Bilirubin Urine: NEGATIVE
Glucose, UA: NEGATIVE mg/dL
Hgb urine dipstick: NEGATIVE
Ketones, ur: NEGATIVE mg/dL
Nitrite: NEGATIVE
Protein, ur: NEGATIVE mg/dL
Specific Gravity, Urine: 1.01 (ref 1.005–1.030)
pH: 5 (ref 5.0–8.0)

## 2024-03-13 LAB — CBG MONITORING, ED: Glucose-Capillary: 200 mg/dL — ABNORMAL HIGH (ref 70–99)

## 2024-03-13 LAB — BETA-HYDROXYBUTYRIC ACID: Beta-Hydroxybutyric Acid: 0.11 mmol/L (ref 0.05–0.27)

## 2024-03-13 LAB — LACTIC ACID, PLASMA: Lactic Acid, Venous: 1.3 mmol/L (ref 0.5–1.9)

## 2024-03-13 LAB — TROPONIN I (HIGH SENSITIVITY): Troponin I (High Sensitivity): 5 ng/L (ref <18)

## 2024-03-13 MED ORDER — SODIUM CHLORIDE 0.9 % IV BOLUS
1000.0000 mL | Freq: Once | INTRAVENOUS | Status: AC
  Administered 2024-03-13: 1000 mL via INTRAVENOUS

## 2024-03-13 NOTE — ED Provider Notes (Signed)
 Care assumed from Dr. Cleotilde, patient with hyperglycemia, pending urinalysis. Plan is for discharge - on antibiotics if UTI is present.  I have reviewed his urinalysis, and my interpretation is no UTI.  I am discharging him with instructions to continue taking metformin and follow-up with his primary care provider.   Raford Lenis, MD 03/14/24 [phone]

## 2024-03-13 NOTE — ED Provider Notes (Signed)
 [place] EMERGENCY DEPARTMENT AT [facility] Provider Note   CSN: [id number] Arrival date & time: 03/13/24  2041     Patient presents with: Hyperglycemia   Curtis Morris is a 83 y.o. male.   The 1 in room 1   Hyperglycemia    This patient is a 83 year old male with a known history of type 2 diabetes atrial fibrillation on Eliquis  as well as a history of some hypertension, he presents to the hospital today with complaint of high blood sugar.  He has been feeling in his usual state of health but has noticed that over the last few days his blood sugar has been staying higher than usual in the 300 range.  He had been taken off of his metformin a year ago by his cardiologist and seem to be doing well but recently noticed his sugar was going up, he only had out of date metformin at home and was told to take a new prescription, he does not yet have it picked up  Prior to Admission medications   Medication Sig Start Date End Date Taking? Authorizing Provider  albuterol  (VENTOLIN  HFA) 108 (90 Base) MCG/ACT inhaler Inhale 1-2 puffs into the lungs every 6 (six) hours as needed for wheezing or shortness of breath. 09/30/23   Daralene Lonni BIRCH, PA-C  apixaban  (ELIQUIS ) 5 MG TABS tablet Take 1 tablet (5 mg total) by mouth 2 (two) times daily. 12/02/21   Lue Elsie BROCKS, MD  aspirin EC 81 MG tablet Take 81 mg by mouth daily. Swallow whole.    [provider]  ATORVASTATIN CALCIUM PO Take 20 mg by mouth at bedtime.    [provider]  benzonatate  (TESSALON ) 100 MG capsule Take 1 capsule (100 mg total) by mouth every 8 (eight) hours. 09/30/23   Daralene Lonni BIRCH, PA-C  Calcium Carbonate-Vitamin D (CALCIUM-VITAMIN D3 PO) Take 1 tablet by mouth 2 (two) times daily.    [provider]  doxycycline  (VIBRAMYCIN ) 100 MG capsule Take 1 capsule (100 mg total) by mouth 2 (two) times daily. 09/30/23   Daralene Lonni BIRCH, PA-C  famotidine  (PEPCID ) 20 MG tablet  Take 1 tablet (20 mg total) by mouth daily. 08/01/22   Dean Clarity, MD  furosemide  (LASIX ) 20 MG tablet Take 1 tablet (20 mg total) by mouth daily. 05/01/22   Theadore Ozell HERO, MD  lipase/protease/amylase (CREON ) 12000 units CPEP capsule Take 12,000 Units by mouth 2 (two) times daily.    [provider]  magnesium oxide (MAG-OX) 400 MG tablet Take 400 mg by mouth daily.    [provider]  metFORMIN (GLUCOPHAGE) 500 MG tablet Take 500 mg by mouth every evening.    [provider]  metoprolol  tartrate (LOPRESSOR ) 50 MG tablet Take 1 tablet (50 mg total) by mouth 2 (two) times daily. 11/27/21   Lue Elsie BROCKS, MD  ondansetron  (ZOFRAN  ODT) 4 MG disintegrating tablet Take 1 tablet (4 mg total) by mouth every 8 (eight) hours as needed for nausea or vomiting. Patient not taking: Reported on 11/24/2021 08/04/18   Little, Vernell Search, MD  ondansetron  (ZOFRAN ) 8 MG tablet Take 1 tablet (8 mg total) by mouth every 8 (eight) hours as needed for nausea or vomiting. 12/06/21   Ruthell Lonni FALCON, PA-C  ondansetron  (ZOFRAN -ODT) 4 MG disintegrating tablet Take 1 tablet (4 mg total) by mouth every 8 (eight) hours as needed for nausea or vomiting. 05/01/22   Theadore Ozell HERO, MD  ondansetron  (ZOFRAN -ODT) 4 MG disintegrating  tablet Take 1 tablet (4 mg total) by mouth every 8 (eight) hours as needed. 08/01/22   Dean Clarity, MD  oxyCODONE -acetaminophen  (PERCOCET/ROXICET) 5-325 MG tablet Take 1 tablet by mouth every 6 (six) hours as needed for severe pain. 12/06/21   Ruthell Lonni FALCON, PA-C  pantoprazole  (PROTONIX ) 40 MG tablet Take 40 mg by mouth 2 (two) times daily.    [provider]  tamsulosin  (FLOMAX ) 0.4 MG CAPS capsule Take 0.4 mg by mouth at bedtime.    [provider]    Allergies: Patient has no known allergies.    Review of Systems  All other systems reviewed and are negative.   Updated Vital Signs BP 120/70 (BP Location: Right Arm)   Pulse 72    Temp 97.7 F (36.5 C) (Oral)   Resp 18   Ht 1.803 m (5' 11)   Wt 56 kg   SpO2 98%   BMI 17.22 kg/m   Physical Exam Vitals and nursing note reviewed.  Constitutional:      General: He is not in acute distress.    Appearance: He is well-developed.  HENT:     Head: Normocephalic and atraumatic.     Mouth/Throat:     Pharynx: No oropharyngeal exudate.  Eyes:     General: No scleral icterus.       Right eye: No discharge.        Left eye: No discharge.     Conjunctiva/sclera: Conjunctivae normal.     Pupils: Pupils are equal, round, and reactive to light.  Neck:     Thyroid: No thyromegaly.     Vascular: No JVD.  Cardiovascular:     Rate and Rhythm: Normal rate and regular rhythm.     Heart sounds: Normal heart sounds. No murmur heard.    No friction rub. No gallop.  Pulmonary:     Effort: Pulmonary effort is normal. No respiratory distress.     Breath sounds: Normal breath sounds. No wheezing or rales.  Abdominal:     General: Bowel sounds are normal. There is no distension.     Palpations: Abdomen is soft. There is no mass.     Tenderness: There is no abdominal tenderness.  Musculoskeletal:        General: No tenderness. Normal range of motion.     Cervical back: Normal range of motion and neck supple.  Lymphadenopathy:     Cervical: No cervical adenopathy.  Skin:    General: Skin is warm and dry.     Findings: No erythema or rash.  Neurological:     Mental Status: He is alert.     Coordination: Coordination normal.  Psychiatric:        Behavior: Behavior normal.     (all labs ordered are listed, but only abnormal results are displayed) Labs Reviewed  CBC WITH DIFFERENTIAL/PLATELET - Abnormal; Notable for the following components:      Result Value   RBC 3.69 (*)    Hemoglobin 11.3 (*)    HCT 34.8 (*)    RDW 18.6 (*)    All other components within normal limits  COMPREHENSIVE METABOLIC PANEL WITH GFR - Abnormal; Notable for the following components:    Sodium 134 (*)    Glucose, Bld 204 (*)    BUN 26 (*)    Total Protein 6.4 (*)    Albumin 3.4 (*)    All other components within normal limits  CBG MONITORING, ED - Abnormal; Notable for the following components:  Glucose-Capillary 200 (*)    All other components within normal limits  LACTIC ACID, PLASMA  BETA-HYDROXYBUTYRIC ACID  URINALYSIS, W/ REFLEX TO CULTURE (INFECTION SUSPECTED)  CBG MONITORING, ED  TROPONIN I (HIGH SENSITIVITY)    EKG: EKG Interpretation Date/Time:  Thursday March 13 2024 21:13:51 EDT Ventricular Rate:  78 PR Interval:    QRS Duration:  179 QT Interval:  399 QTC Calculation: 455 R Axis:   61  Text Interpretation: Atrial fibrillation Anterior infarct, old Since last tracing rate faster Confirmed by Cleotilde Rogue ([id number]) on 03/13/2024 9:35:45 PM  Radiology: No results found.   Procedures   Medications Ordered in the ED  sodium chloride  0.9 % bolus 1,000 mL (0 mLs Intravenous Stopped 03/13/24 2301)                                    Medical Decision Making Amount and/or Complexity of Data Reviewed Labs: ordered.   This patient is in no distress, vital signs are reassuring without tachycardia fever or hypotension, as a known diabetic and recently having had to restart his metformin I believe that he is likely playing catch up with hyperglycemia and his blood sugar here came back at 200 with no signs of DKA.  Troponin negative, EKG negative, his abdominal exam is benign chest exam is benign and urinalysis is pending at the time of change of shift.  Anticipate that this patient is discharged home, I have confirmed with him that he has his new metformin prescription coming in the mail, until then he has his own prescription.  He is agreeable to continue with diabetic food regimen and close follow-up with the [facility] where he gets his care.  He understands indications for return  I have discussed with the patient at the bedside the results, and the meaning  of these results.  They have had opportunity to ask questions,  expressed their understanding to the need for follow-up with primary care physician  Dr. Raford will assume care of this patient pending UA.     Final diagnoses:  Hyperglycemia    ED Discharge Orders     None          Cleotilde Rogue, MD 03/13/24 2318

## 2024-03-13 NOTE — Discharge Instructions (Signed)
 [facility] but I suspect as you continue to eat a healthy diabetic diet with low carbohydrates and continue to take your metformin things should gradually get better.  If your blood sugar continues to go higher such as 400 or 500 or you are feeling ill or sick in any way please return to the emergency department.

## 2024-03-13 NOTE — ED Triage Notes (Signed)
 Pt here with blood glucose problems. Per wife, blood sugar has been up and down x 3 days. States high today with a reading of 326. States contacted PCP and was told they were calling in some Glucophage as theirs is currently expired but that if it continued to go up, seek care at ER.

## 2024-05-05 ENCOUNTER — Other Ambulatory Visit: Payer: Self-pay

## 2024-05-05 ENCOUNTER — Emergency Department (HOSPITAL_COMMUNITY)

## 2024-05-05 ENCOUNTER — Emergency Department (HOSPITAL_COMMUNITY)
Admission: EM | Admit: 2024-05-05 | Discharge: 2024-05-05 | Disposition: A | Discharge status: Home / Self Care | Attending: Emergency Medicine | Admitting: Emergency Medicine

## 2024-05-05 ENCOUNTER — Encounter (HOSPITAL_COMMUNITY): Payer: Self-pay

## 2024-05-05 DIAGNOSIS — K5900 Constipation, unspecified: Secondary | ICD-10-CM | POA: Insufficient documentation

## 2024-05-05 DIAGNOSIS — Z7982 Long term (current) use of aspirin: Secondary | ICD-10-CM | POA: Insufficient documentation

## 2024-05-05 DIAGNOSIS — E119 Type 2 diabetes mellitus without complications: Secondary | ICD-10-CM | POA: Diagnosis not present

## 2024-05-05 DIAGNOSIS — Z7984 Long term (current) use of oral hypoglycemic drugs: Secondary | ICD-10-CM | POA: Diagnosis not present

## 2024-05-05 DIAGNOSIS — Z7901 Long term (current) use of anticoagulants: Secondary | ICD-10-CM | POA: Insufficient documentation

## 2024-05-05 LAB — URINALYSIS, ROUTINE W REFLEX MICROSCOPIC
Bilirubin Urine: NEGATIVE
Glucose, UA: NEGATIVE mg/dL
Hgb urine dipstick: NEGATIVE
Ketones, ur: NEGATIVE mg/dL
Leukocytes,Ua: NEGATIVE
Nitrite: NEGATIVE
Protein, ur: NEGATIVE mg/dL
Specific Gravity, Urine: 1.008 (ref 1.005–1.030)
pH: 7 (ref 5.0–8.0)

## 2024-05-05 LAB — COMPREHENSIVE METABOLIC PANEL WITH GFR
ALT: 21 U/L (ref 0–44)
AST: 19 U/L (ref 15–41)
Albumin: 3.7 g/dL (ref 3.5–5.0)
Alkaline Phosphatase: 43 U/L (ref 38–126)
Anion gap: 11 (ref 5–15)
BUN: 13 mg/dL (ref 8–23)
CO2: 27 mmol/L (ref 22–32)
Calcium: 9.2 mg/dL (ref 8.9–10.3)
Chloride: 97 mmol/L — ABNORMAL LOW (ref 98–111)
Creatinine, Ser: 0.83 mg/dL (ref 0.61–1.24)
GFR, Estimated: >60 mL/min (ref >60)
Glucose, Bld: 127 mg/dL — ABNORMAL HIGH (ref 70–99)
Potassium: 4.1 mmol/L (ref 3.5–5.1)
Sodium: 135 mmol/L (ref 135–145)
Total Bilirubin: 0.8 mg/dL (ref 0.0–1.2)
Total Protein: 6.7 g/dL (ref 6.5–8.1)

## 2024-05-05 LAB — CBC WITH DIFFERENTIAL/PLATELET
Abs Immature Granulocytes: 0.02 K/uL (ref 0.00–0.07)
Basophils Absolute: 0.1 K/uL (ref 0.0–0.1)
Basophils Relative: 1 %
Eosinophils Absolute: 0.1 K/uL (ref 0.0–0.5)
Eosinophils Relative: 2 %
HCT: 38.4 % — ABNORMAL LOW (ref 39.0–52.0)
Hemoglobin: 12.3 g/dL — ABNORMAL LOW (ref 13.0–17.0)
Immature Granulocytes: 0 %
Lymphocytes Relative: 32 %
Lymphs Abs: 1.9 K/uL (ref 0.7–4.0)
MCH: 31.5 pg (ref 26.0–34.0)
MCHC: 32 g/dL (ref 30.0–36.0)
MCV: 98.2 fL (ref 80.0–100.0)
Monocytes Absolute: 0.6 K/uL (ref 0.1–1.0)
Monocytes Relative: 9 %
Neutro Abs: 3.3 K/uL (ref 1.7–7.7)
Neutrophils Relative %: 56 %
Platelets: 303 K/uL (ref 150–400)
RBC: 3.91 MIL/uL — ABNORMAL LOW (ref 4.22–5.81)
RDW: 16.6 % — ABNORMAL HIGH (ref 11.5–15.5)
WBC: 6 K/uL (ref 4.0–10.5)
nRBC: 0 % (ref 0.0–0.2)

## 2024-05-05 LAB — LIPASE, BLOOD: Lipase: 22 U/L (ref 11–51)

## 2024-05-05 MED ORDER — POLYETHYLENE GLYCOL 3350 17 G PO PACK: 17.0000 g | PACK | Freq: Two times a day (BID) | ORAL | 0 refills | Status: AC

## 2024-05-05 MED ORDER — LACTULOSE 20 GM/30ML PO SOLN: 20.0000 mL | Freq: Two times a day (BID) | ORAL | 0 refills | Status: AC

## 2024-05-05 NOTE — ED Triage Notes (Signed)
 Pt arrived via POV from home c/o constipation and lower abdominal pain with nausea X 3 days. Pt reports trying multiple laxatives w/o relief. Pt reports trying suppositories w/o relief as well.

## 2024-05-05 NOTE — Discharge Instructions (Signed)
 You were seen for your constipation in the emergency department.   At home, please take the miralax  and lactulose for your constipation until you are having normal bowel movements. At that point, you may stop the lactulose and just continue the miralax  to keep you regular.    Check your MyChart online for the results of any tests that had not resulted by the time you left the emergency department.   Follow-up with your primary doctor in 2-3 days regarding your visit.    Return immediately to the emergency department if you experience any of the following: severe abdominal pain, vomiting, or any other concerning symptoms.    Thank you for visiting our Emergency Department. It was a pleasure taking care of you today.

## 2024-05-05 NOTE — ED Provider Notes (Signed)
 [place] EMERGENCY DEPARTMENT AT [facility] Provider Note   CSN: [id number] Arrival date & time: 05/05/24  1032     Patient presents with: Constipation   Curtis Morris is a 83 y.o. male.   83 year old male with history of diabetes on metformin, stomach surgery for bleeding, splenectomy, and appendectomy who presents to the emergency department with constipation.  Patient reports for the past month he has been constipated.  Typically goes to have a bowel movement daily but has been having a bowel movement every 3 to 4 days.  Last bowel movement was Saturday.  Says he had to disimpact himself to have 1.  No nausea or vomiting.  Still passing gas.  No abdominal pain.  Did start metformin around the time that the symptoms started and thinks it might be related.  Give himself a suppository today and tried Ex-Lax without relief.       Prior to Admission medications   Medication Sig Start Date End Date Taking? Authorizing Provider  Lactulose 20 GM/30ML SOLN Take 20 mLs (13.3333 g total) by mouth in the morning and at bedtime. 05/05/24  Yes Yolande Lamar BROCKS, MD  polyethylene glycol (MIRALAX ) 17 g packet Take 17 g by mouth 2 (two) times daily. 05/05/24  Yes Yolande Lamar BROCKS, MD  albuterol  (VENTOLIN  HFA) 108 [phone] Base) MCG/ACT inhaler Inhale 1-2 puffs into the lungs every 6 (six) hours as needed for wheezing or shortness of breath. 09/30/23   Daralene Lonni BIRCH, PA-C  apixaban  (ELIQUIS ) 5 MG TABS tablet Take 1 tablet (5 mg total) by mouth 2 (two) times daily. 12/02/21   Lue Elsie BROCKS, MD  aspirin EC 81 MG tablet Take 81 mg by mouth daily. Swallow whole.    [provider]  ATORVASTATIN CALCIUM PO Take 20 mg by mouth at bedtime.    [provider]  benzonatate  (TESSALON ) 100 MG capsule Take 1 capsule (100 mg total) by mouth every 8 (eight) hours. 09/30/23   Daralene Lonni BIRCH, PA-C  Calcium Carbonate-Vitamin D (CALCIUM-VITAMIN D3 PO) Take 1 tablet by mouth  2 (two) times daily.    [provider]  doxycycline  (VIBRAMYCIN ) 100 MG capsule Take 1 capsule (100 mg total) by mouth 2 (two) times daily. 09/30/23   Daralene Lonni BIRCH, PA-C  famotidine  (PEPCID ) 20 MG tablet Take 1 tablet (20 mg total) by mouth daily. 08/01/22   Dean Clarity, MD  furosemide  (LASIX ) 20 MG tablet Take 1 tablet (20 mg total) by mouth daily. 05/01/22   Theadore Ozell HERO, MD  lipase/protease/amylase (CREON ) 12000 units CPEP capsule Take 12,000 Units by mouth 2 (two) times daily.    [provider]  magnesium oxide (MAG-OX) 400 MG tablet Take 400 mg by mouth daily.    [provider]  metFORMIN (GLUCOPHAGE) 500 MG tablet Take 500 mg by mouth every evening.    [provider]  metoprolol  tartrate (LOPRESSOR ) 50 MG tablet Take 1 tablet (50 mg total) by mouth 2 (two) times daily. 11/27/21   Lue Elsie BROCKS, MD  ondansetron  (ZOFRAN  ODT) 4 MG disintegrating tablet Take 1 tablet (4 mg total) by mouth every 8 (eight) hours as needed for nausea or vomiting. Patient not taking: Reported on 11/24/2021 08/04/18   Little, Vernell Search, MD  ondansetron  (ZOFRAN ) 8 MG tablet Take 1 tablet (8 mg total) by mouth every 8 (eight) hours as needed for nausea or vomiting. 12/06/21   Ruthell Lonni FALCON, PA-C  ondansetron  (ZOFRAN -ODT) 4 MG disintegrating tablet Take  1 tablet (4 mg total) by mouth every 8 (eight) hours as needed for nausea or vomiting. 05/01/22   Theadore Ozell HERO, MD  ondansetron  (ZOFRAN -ODT) 4 MG disintegrating tablet Take 1 tablet (4 mg total) by mouth every 8 (eight) hours as needed. 08/01/22   Dean Clarity, MD  oxyCODONE -acetaminophen  (PERCOCET/ROXICET) 5-325 MG tablet Take 1 tablet by mouth every 6 (six) hours as needed for severe pain. 12/06/21   Ruthell Lonni FALCON, PA-C  pantoprazole  (PROTONIX ) 40 MG tablet Take 40 mg by mouth 2 (two) times daily.    [provider]  tamsulosin  (FLOMAX ) 0.4 MG CAPS capsule Take 0.4 mg by mouth at  bedtime.    [provider]    Allergies: Patient has no known allergies.    Review of Systems  Updated Vital Signs BP (!) 139/99   Pulse 71   Temp 98.1 F (36.7 C)   Resp 17   Ht 5' 11 (1.803 m)   Wt 54 kg   SpO2 98%   BMI 16.60 kg/m   Physical Exam Constitutional:      Appearance: Normal appearance.  Abdominal:     General: Abdomen is flat. There is no distension.     Palpations: Abdomen is soft. There is no mass.     Tenderness: There is no abdominal tenderness. There is no guarding.  Neurological:     Mental Status: He is alert.     (all labs ordered are listed, but only abnormal results are displayed) Labs Reviewed  CBC WITH DIFFERENTIAL/PLATELET - Abnormal; Notable for the following components:      Result Value   RBC 3.91 (*)    Hemoglobin 12.3 (*)    HCT 38.4 (*)    RDW 16.6 (*)    All other components within normal limits  COMPREHENSIVE METABOLIC PANEL WITH GFR - Abnormal; Notable for the following components:   Chloride 97 (*)    Glucose, Bld 127 (*)    All other components within normal limits  LIPASE, BLOOD  URINALYSIS, ROUTINE W REFLEX MICROSCOPIC    EKG: None  Radiology: DG Abdomen 1 View Result Date: 05/05/2024 CLINICAL DATA:  Constipation. EXAM: ABDOMEN - 1 VIEW COMPARISON:  08/04/2018. FINDINGS: Nonobstructive bowel gas pattern. Gas and scattered stool within the colon. Surgical clips in the right upper quadrant. No acute osseous abnormality. IMPRESSION: Nonobstructive bowel gas pattern. Gas and scattered stool within the colon. Electronically Signed   By: Harrietta Sherry M.D.   On: 05/05/2024 12:36     Procedures   Medications Ordered in the ED - No data to display                                  Medical Decision Making Amount and/or Complexity of Data Reviewed Labs: ordered. Radiology: ordered.  Risk OTC drugs. Prescription drug management.   83 year old male with history of diabetes on metformin, stomach surgery  for bleeding, splenectomy, and appendectomy who presents to the emergency department with constipation.  Initial Ddx:  Constipation, bowel obstruction, ileus, metformin side effect  MDM/Course:  Patient presents emergency department with constipation.  Started metformin around the time of this happened.  Not having any nausea or vomiting or abdominal pain.  On exam no abdominal distention.  No abdominal tenderness to palpation.  Offered the patient enema here in the emergency department but he declined.  Will send him home on oral bowel regimen.  Suspect this is  likely due to his metformin.  Of note, did have an x-ray in the emergency department that did not show any evidence of obstruction.  Given the fact that he is not having any abdominal pain, distention, or vomiting feel this is highly unlikely.    This patient presents to the ED for concern of complaints listed in HPI, this involves an extensive number of treatment options, and is a complaint that carries with it a high risk of complications and morbidity. Disposition including potential need for admission considered.   Dispo: DC Home. Return precautions discussed including, but not limited to, those listed in the AVS. Allowed pt time to ask questions which were answered fully prior to dc.  Additional history obtained from spouse Records reviewed Outpatient Clinic Notes The following labs were independently interpreted: Chemistry and show no acute abnormality I independently reviewed the following imaging with scope of interpretation limited to determining acute life threatening conditions related to emergency care: Abdominal x-ray and agree with the radiologist interpretation with the following exceptions: none I personally reviewed and interpreted cardiac monitoring: normal sinus rhythm  I personally reviewed and interpreted the pt's EKG: see above for interpretation  I have reviewed the patients home medications and made adjustments as  needed Social Determinants of health:  Geriatric   Portions of this note were generated with Scientist, clinical (histocompatibility and immunogenetics). Dictation errors may occur despite best attempts at proofreading.     Final diagnoses:  Constipation, unspecified constipation type    ED Discharge Orders          Ordered    polyethylene glycol (MIRALAX ) 17 g packet  2 times daily        05/05/24 1706    Lactulose 20 GM/30ML SOLN  2 times daily        05/05/24 1706               Yolande Lamar BROCKS, MD 05/05/24 1956

## 2024-07-28 ENCOUNTER — Encounter (HOSPITAL_COMMUNITY): Payer: Self-pay

## 2024-07-28 ENCOUNTER — Emergency Department (HOSPITAL_COMMUNITY)

## 2024-07-28 ENCOUNTER — Emergency Department (HOSPITAL_COMMUNITY): Admission: EM | Admit: 2024-07-28 | Discharge: 2024-07-28 | Disposition: A | Discharge status: Home / Self Care

## 2024-07-28 ENCOUNTER — Other Ambulatory Visit: Payer: Self-pay

## 2024-07-28 DIAGNOSIS — I4891 Unspecified atrial fibrillation: Secondary | ICD-10-CM | POA: Insufficient documentation

## 2024-07-28 DIAGNOSIS — R0789 Other chest pain: Secondary | ICD-10-CM | POA: Insufficient documentation

## 2024-07-28 DIAGNOSIS — Z7901 Long term (current) use of anticoagulants: Secondary | ICD-10-CM | POA: Insufficient documentation

## 2024-07-28 DIAGNOSIS — R079 Chest pain, unspecified: Secondary | ICD-10-CM | POA: Diagnosis present

## 2024-07-28 DIAGNOSIS — Z7982 Long term (current) use of aspirin: Secondary | ICD-10-CM | POA: Diagnosis not present

## 2024-07-28 LAB — CBC WITH DIFFERENTIAL/PLATELET
Abs Immature Granulocytes: 0.01 K/uL (ref 0.00–0.07)
Basophils Absolute: 0.1 K/uL (ref 0.0–0.1)
Basophils Relative: 1 %
Eosinophils Absolute: 0.1 K/uL (ref 0.0–0.5)
Eosinophils Relative: 2 %
HCT: 36.1 % — ABNORMAL LOW (ref 39.0–52.0)
Hemoglobin: 11.5 g/dL — ABNORMAL LOW (ref 13.0–17.0)
Immature Granulocytes: 0 %
Lymphocytes Relative: 28 %
Lymphs Abs: 1.6 K/uL (ref 0.7–4.0)
MCH: 31 pg (ref 26.0–34.0)
MCHC: 31.9 g/dL (ref 30.0–36.0)
MCV: 97.3 fL (ref 80.0–100.0)
Monocytes Absolute: 0.6 K/uL (ref 0.1–1.0)
Monocytes Relative: 10 %
Neutro Abs: 3.5 K/uL (ref 1.7–7.7)
Neutrophils Relative %: 59 %
Platelets: 347 K/uL (ref 150–400)
RBC: 3.71 MIL/uL — ABNORMAL LOW (ref 4.22–5.81)
RDW: 15.5 % (ref 11.5–15.5)
WBC: 5.9 K/uL (ref 4.0–10.5)
nRBC: 0 % (ref 0.0–0.2)

## 2024-07-28 LAB — COMPREHENSIVE METABOLIC PANEL WITH GFR
ALT: 20 U/L (ref 0–44)
AST: 17 U/L (ref 15–41)
Albumin: 4 g/dL (ref 3.5–5.0)
Alkaline Phosphatase: 49 U/L (ref 38–126)
Anion gap: 11 (ref 5–15)
BUN: 16 mg/dL (ref 8–23)
CO2: 21 mmol/L — ABNORMAL LOW (ref 22–32)
Calcium: 8.8 mg/dL — ABNORMAL LOW (ref 8.9–10.3)
Chloride: 105 mmol/L (ref 98–111)
Creatinine, Ser: 0.71 mg/dL (ref 0.61–1.24)
GFR, Estimated: >60 mL/min
Glucose, Bld: 210 mg/dL — ABNORMAL HIGH (ref 70–99)
Potassium: 4.1 mmol/L (ref 3.5–5.1)
Sodium: 138 mmol/L (ref 135–145)
Total Bilirubin: 0.5 mg/dL (ref 0.0–1.2)
Total Protein: 6.6 g/dL (ref 6.5–8.1)

## 2024-07-28 LAB — PRO BRAIN NATRIURETIC PEPTIDE: Pro Brain Natriuretic Peptide: 1475 pg/mL — ABNORMAL HIGH

## 2024-07-28 LAB — TROPONIN T, HIGH SENSITIVITY
Troponin T High Sensitivity: <15 ng/L (ref 0–19)
Troponin T High Sensitivity: <15 ng/L (ref 0–19)

## 2024-07-28 MED ORDER — METHOCARBAMOL 500 MG PO TABS: 500.0000 mg | ORAL_TABLET | Freq: Four times a day (QID) | ORAL | 0 refills | Status: AC | PRN

## 2024-07-28 MED ORDER — OXYCODONE HCL 5 MG PO TABS
5.0000 mg | ORAL_TABLET | Freq: Once | ORAL | Status: AC
  Administered 2024-07-28: 5 mg via ORAL
  Filled 2024-07-28: qty 1

## 2024-07-28 NOTE — Discharge Instructions (Signed)
 Call and follow-up with your primary care doctor and your cardiologist in 1 to 2 weeks.  Sooner medications as prescribed you can use Robaxin  up to 4 times a day as needed for pain and Tylenol  as needed for pain.  Return to the ER for new or worsening symptoms.

## 2024-07-28 NOTE — ED Provider Notes (Signed)
 " [place] [facility]MENT AT [facility] Provider Note   CSN: [id number] Arrival date & time: 07/28/24  9070     Patient presents with: Chest Pain   Curtis Morris is a 83 y.o. male.   83 year old male presents for evaluation of chest pain.  States it woke him up out of sleep last night.  States he was diaphoretic when it happened last night as well.  He states it is gone away at this time.  States since last night has happened 3 or 4 times because when its own.  He is hard time describing the pain but states it was located in the center of his chest.  He does have a history of A-fib and is on Eliquis .  Denies any other symptoms or concerns.   Chest Pain Associated symptoms: no abdominal pain, no back pain, no cough, no fever, no palpitations, no shortness of breath and no vomiting        Prior to Admission medications  Medication Sig Start Date End Date Taking? Authorizing Provider  methocarbamol  (ROBAXIN ) 500 MG tablet Take 1 tablet (500 mg total) by mouth every 6 (six) hours as needed for up to 5 days for muscle spasms (pain). 07/28/24 08/02/24 Yes Jestina Stephani L, DO  albuterol  (VENTOLIN  HFA) 108 (90 Base) MCG/ACT inhaler Inhale 1-2 puffs into the lungs every 6 (six) hours as needed for wheezing or shortness of breath. 09/30/23   Daralene Lonni BIRCH, PA-C  apixaban  (ELIQUIS ) 5 MG TABS tablet Take 1 tablet (5 mg total) by mouth 2 (two) times daily. 12/02/21   Lue Elsie BROCKS, MD  aspirin EC 81 MG tablet Take 81 mg by mouth daily. Swallow whole.    [provider]  ATORVASTATIN CALCIUM PO Take 20 mg by mouth at bedtime.    [provider]  benzonatate  (TESSALON ) 100 MG capsule Take 1 capsule (100 mg total) by mouth every 8 (eight) hours. 09/30/23   Daralene Lonni BIRCH, PA-C  Calcium Carbonate-Vitamin D (CALCIUM-VITAMIN D3 PO) Take 1 tablet by mouth 2 (two) times daily.    [provider]  doxycycline  (VIBRAMYCIN ) 100 MG capsule  Take 1 capsule (100 mg total) by mouth 2 (two) times daily. 09/30/23   Daralene Lonni BIRCH, PA-C  famotidine  (PEPCID ) 20 MG tablet Take 1 tablet (20 mg total) by mouth daily. 08/01/22   Dean Clarity, MD  furosemide  (LASIX ) 20 MG tablet Take 1 tablet (20 mg total) by mouth daily. 05/01/22   Bero, Michael M, MD  Lactulose  20 GM/30ML SOLN Take 20 mLs (13.3333 g total) by mouth in the morning and at bedtime. 05/05/24   Yolande Lamar BROCKS, MD  lipase/protease/amylase (CREON ) 12000 units CPEP capsule Take 12,000 Units by mouth 2 (two) times daily.    [provider]  magnesium oxide (MAG-OX) 400 MG tablet Take 400 mg by mouth daily.    [provider]  metFORMIN (GLUCOPHAGE) 500 MG tablet Take 500 mg by mouth every evening.    [provider]  metoprolol  tartrate (LOPRESSOR ) 50 MG tablet Take 1 tablet (50 mg total) by mouth 2 (two) times daily. 11/27/21   Lue Elsie BROCKS, MD  ondansetron  (ZOFRAN  ODT) 4 MG disintegrating tablet Take 1 tablet (4 mg total) by mouth every 8 (eight) hours as needed for nausea or vomiting. Patient not taking: Reported on 11/24/2021 08/04/18   Little, Vernell Search, MD  ondansetron  (ZOFRAN ) 8 MG tablet Take 1 tablet (8 mg total) by mouth every 8 (eight)  hours as needed for nausea or vomiting. 12/06/21   Ruthell Lonni FALCON, PA-C  ondansetron  (ZOFRAN -ODT) 4 MG disintegrating tablet Take 1 tablet (4 mg total) by mouth every 8 (eight) hours as needed for nausea or vomiting. 05/01/22   Theadore Ozell HERO, MD  ondansetron  (ZOFRAN -ODT) 4 MG disintegrating tablet Take 1 tablet (4 mg total) by mouth every 8 (eight) hours as needed. 08/01/22   Dean Clarity, MD  oxyCODONE -acetaminophen  (PERCOCET/ROXICET) 5-325 MG tablet Take 1 tablet by mouth every 6 (six) hours as needed for severe pain. 12/06/21   Ruthell Lonni FALCON, PA-C  pantoprazole  (PROTONIX ) 40 MG tablet Take 40 mg by mouth 2 (two) times daily.    [provider]  polyethylene glycol (MIRALAX )  17 g packet Take 17 g by mouth 2 (two) times daily. 05/05/24   Yolande Lamar BROCKS, MD  tamsulosin  (FLOMAX ) 0.4 MG CAPS capsule Take 0.4 mg by mouth at bedtime.    [provider]    Allergies: Patient has no known allergies.    Review of Systems  Constitutional:  Negative for chills and fever.  HENT:  Negative for ear pain and sore throat.   Eyes:  Negative for pain and visual disturbance.  Respiratory:  Negative for cough and shortness of breath.   Cardiovascular:  Positive for chest pain. Negative for palpitations.  Gastrointestinal:  Negative for abdominal pain and vomiting.  Genitourinary:  Negative for dysuria and hematuria.  Musculoskeletal:  Negative for arthralgias and back pain.  Skin:  Negative for color change and rash.  Neurological:  Negative for seizures and syncope.  All other systems reviewed and are negative.   Updated Vital Signs BP (!) 140/86 (BP Location: Left Arm)   Pulse (!) 54   Temp 97.7 F (36.5 C) (Oral)   Resp 19   Ht 5' 11 (1.803 m)   Wt 54 kg   SpO2 90%   BMI 16.60 kg/m   Physical Exam Vitals and nursing note reviewed.  Constitutional:      General: He is not in acute distress.    Appearance: He is well-developed. He is not ill-appearing.  HENT:     Head: Normocephalic and atraumatic.  Eyes:     Conjunctiva/sclera: Conjunctivae normal.  Cardiovascular:     Rate and Rhythm: Normal rate and regular rhythm.     Heart sounds: No murmur heard. Pulmonary:     Effort: Pulmonary effort is normal. No respiratory distress.     Breath sounds: Normal breath sounds.  Abdominal:     Palpations: Abdomen is soft.     Tenderness: There is no abdominal tenderness.  Musculoskeletal:        General: No swelling.     Cervical back: Neck supple.  Skin:    General: Skin is warm and dry.     Capillary Refill: Capillary refill takes less than 2 seconds.  Neurological:     Mental Status: He is alert.  Psychiatric:        Mood and Affect: Mood  normal.     (all labs ordered are listed, but only abnormal results are displayed) Labs Reviewed  CBC WITH DIFFERENTIAL/PLATELET - Abnormal; Notable for the following components:      Result Value   RBC 3.71 (*)    Hemoglobin 11.5 (*)    HCT 36.1 (*)    All other components within normal limits  COMPREHENSIVE METABOLIC PANEL WITH GFR - Abnormal; Notable for the following components:   CO2 21 (*)  Glucose, Bld 210 (*)    Calcium 8.8 (*)    All other components within normal limits  PRO BRAIN NATRIURETIC PEPTIDE - Abnormal; Notable for the following components:   Pro Brain Natriuretic Peptide 1,475.0 (*)    All other components within normal limits  TROPONIN T, HIGH SENSITIVITY  TROPONIN T, HIGH SENSITIVITY    EKG: EKG Interpretation Date/Time:  Monday July 28 2024 09:45:17 EST Ventricular Rate:  89 PR Interval:    QRS Duration:  92 QT Interval:  396 QTC Calculation: 481 R Axis:   -58  Text Interpretation: Atrial fibrillation Left axis deviation Incomplete right bundle branch block Anteroseptal infarct , age undetermined  New T wave inversions in the lateral leads when compared with prior EKG from 03/13/2024 Confirmed by Gennaro Bouchard ([id number]) on 07/28/2024 9:53:48 AM  Radiology: [facility] Chest 2 View Result Date: 07/28/2024 EXAM: 2 VIEW(S) XRAY OF THE CHEST 07/28/2024 10:18:32 AM COMPARISON: 09/30/2023 CLINICAL HISTORY: chest pain, SOB FINDINGS: LINES, TUBES AND DEVICES: Surgical clips at gastroesophageal junction noted. LUNGS AND PLEURA: Linear scarring in left mid lung, unchanged. No pleural effusion. No pneumothorax. HEART AND MEDIASTINUM: Cardiomegaly, unchanged. Aortic arch calcifications. BONES AND SOFT TISSUES: No acute osseous abnormality. IMPRESSION: 1. No acute findings. 2. Cardiomegaly, unchanged. Electronically signed by: Norleen Boxer MD 07/28/2024 11:06 AM EST RP Workstation: [id number]     Procedures   Medications Ordered in the ED  oxyCODONE  (Oxy  IR/ROXICODONE ) immediate release tablet 5 mg (5 mg Oral Given 07/28/24 1228)                                    Medical Decision Making Cardiac monitor interpretation: Atrial fibrillation, rate controlled, no ectopy  Patient here for chest pain that is sharp in nature comes and goes.  He had some recurrence of her chest pain while in the ER but none on arrival.  I think it may be musculoskeletal in nature as it is reproducible on exam.  Will give prescription for Robaxin .  Will give him 1 dose of oxycodone  here and he is advised to use Tylenol  as needed for pain at home.  EKG is unremarkable and remainder of workup is negative including negative troponins x 2.  Advised.  With primary care and cardiology and otherwise return to the ER for new or worsening symptoms.  All results and plan discussed with patient and wife at bedside and they feel comfortable to plan be discharged home.  Problems Addressed: Atypical chest pain: acute illness or injury  Amount and/or Complexity of Data Reviewed External Data Reviewed: notes.    Details: Prior ED records reviewed and patient seen 05-05-2024 for constipation Labs: ordered. Decision-making details documented in ED Course.    Details: Ordered and reviewed by me and troponin is negative x 2, labs otherwise unremarkable Radiology: ordered and independent interpretation performed. Decision-making details documented in ED Course.    Details: Ordered and interpreted by me independently of radiology Chest x-ray, shows no acute abnormality ECG/medicine tests: ordered and independent interpretation performed. Decision-making details documented in ED Course.    Details: Ordered and interpreted by me in the absence of cardiology and shows fibrillation, no STEMI or significant change when compared to prior EKG  Risk OTC drugs. Prescription drug management.     Final diagnoses:  Atypical chest pain    ED Discharge Orders          Ordered  methocarbamol  (ROBAXIN ) 500 MG tablet  Every 6 hours PRN        07/28/24 1221               Rykar Lebleu L, DO 07/28/24 1504  "

## 2024-07-28 NOTE — ED Triage Notes (Signed)
 Pt arrived via POV c/o worsening chest pain. Pt reports Hx of A-Fib and reports pain has progressively worsening since last week.

## 2024-08-25 ENCOUNTER — Encounter (HOSPITAL_COMMUNITY): Payer: Self-pay

## 2024-08-25 ENCOUNTER — Other Ambulatory Visit: Payer: Self-pay

## 2024-08-25 ENCOUNTER — Emergency Department (HOSPITAL_COMMUNITY)
Admission: EM | Admit: 2024-08-25 | Discharge: 2024-08-25 | Disposition: A | Discharge status: Home / Self Care | Attending: Emergency Medicine | Admitting: Emergency Medicine

## 2024-08-25 DIAGNOSIS — Z7982 Long term (current) use of aspirin: Secondary | ICD-10-CM | POA: Diagnosis not present

## 2024-08-25 DIAGNOSIS — R110 Nausea: Secondary | ICD-10-CM | POA: Insufficient documentation

## 2024-08-25 DIAGNOSIS — Z7901 Long term (current) use of anticoagulants: Secondary | ICD-10-CM | POA: Insufficient documentation

## 2024-08-25 DIAGNOSIS — Z79899 Other long term (current) drug therapy: Secondary | ICD-10-CM | POA: Diagnosis not present

## 2024-08-25 DIAGNOSIS — E860 Dehydration: Secondary | ICD-10-CM | POA: Insufficient documentation

## 2024-08-25 DIAGNOSIS — Z7984 Long term (current) use of oral hypoglycemic drugs: Secondary | ICD-10-CM | POA: Diagnosis not present

## 2024-08-25 DIAGNOSIS — R197 Diarrhea, unspecified: Secondary | ICD-10-CM | POA: Insufficient documentation

## 2024-08-25 DIAGNOSIS — E119 Type 2 diabetes mellitus without complications: Secondary | ICD-10-CM | POA: Insufficient documentation

## 2024-08-25 DIAGNOSIS — I10 Essential (primary) hypertension: Secondary | ICD-10-CM | POA: Diagnosis not present

## 2024-08-25 LAB — CBC WITH DIFFERENTIAL/PLATELET
Abs Immature Granulocytes: 0.01 K/uL (ref 0.00–0.07)
Basophils Absolute: 0.1 K/uL (ref 0.0–0.1)
Basophils Relative: 1 %
Eosinophils Absolute: 0.1 K/uL (ref 0.0–0.5)
Eosinophils Relative: 2 %
HCT: 38.4 % — ABNORMAL LOW (ref 39.0–52.0)
Hemoglobin: 12.4 g/dL — ABNORMAL LOW (ref 13.0–17.0)
Immature Granulocytes: 0 %
Lymphocytes Relative: 31 %
Lymphs Abs: 1.8 K/uL (ref 0.7–4.0)
MCH: 30.8 pg (ref 26.0–34.0)
MCHC: 32.3 g/dL (ref 30.0–36.0)
MCV: 95.5 fL (ref 80.0–100.0)
Monocytes Absolute: 0.4 K/uL (ref 0.1–1.0)
Monocytes Relative: 8 %
Neutro Abs: 3.2 K/uL (ref 1.7–7.7)
Neutrophils Relative %: 58 %
Platelets: 342 K/uL (ref 150–400)
RBC: 4.02 MIL/uL — ABNORMAL LOW (ref 4.22–5.81)
RDW: 15.2 % (ref 11.5–15.5)
WBC: 5.6 K/uL (ref 4.0–10.5)
nRBC: 0 % (ref 0.0–0.2)

## 2024-08-25 LAB — COMPREHENSIVE METABOLIC PANEL WITH GFR
ALT: 15 U/L (ref 0–44)
AST: 20 U/L (ref 15–41)
Albumin: 4.1 g/dL (ref 3.5–5.0)
Alkaline Phosphatase: 44 U/L (ref 38–126)
Anion gap: 13 (ref 5–15)
BUN: 17 mg/dL (ref 8–23)
CO2: 21 mmol/L — ABNORMAL LOW (ref 22–32)
Calcium: 9.4 mg/dL (ref 8.9–10.3)
Chloride: 102 mmol/L (ref 98–111)
Creatinine, Ser: 0.84 mg/dL (ref 0.61–1.24)
GFR, Estimated: >60 mL/min
Glucose, Bld: 168 mg/dL — ABNORMAL HIGH (ref 70–99)
Potassium: 4.6 mmol/L (ref 3.5–5.1)
Sodium: 137 mmol/L (ref 135–145)
Total Bilirubin: 0.4 mg/dL (ref 0.0–1.2)
Total Protein: 6.6 g/dL (ref 6.5–8.1)

## 2024-08-25 LAB — LIPASE, BLOOD: Lipase: 11 U/L (ref 11–51)

## 2024-08-25 MED ORDER — SODIUM CHLORIDE 0.9 % IV BOLUS
500.0000 mL | Freq: Once | INTRAVENOUS | Status: AC
  Administered 2024-08-25: 500 mL via INTRAVENOUS

## 2024-08-25 MED ORDER — ONDANSETRON HCL 4 MG/2ML IJ SOLN
4.0000 mg | Freq: Once | INTRAMUSCULAR | Status: AC
  Administered 2024-08-25: 4 mg via INTRAVENOUS
  Filled 2024-08-25: qty 2

## 2024-08-25 MED ORDER — ONDANSETRON 4 MG PO TBDP: 4.0000 mg | ORAL_TABLET | Freq: Three times a day (TID) | ORAL | 0 refills | Status: AC | PRN

## 2024-08-25 NOTE — ED Notes (Signed)
 Pt verbalized understanding of discharge instructions. Opportunity for questions provided.

## 2024-08-25 NOTE — ED Provider Notes (Signed)
 " [place] EMERGENCY DEPARTMENT AT [facility] Provider Note   CSN: [id number] Arrival date & time: 08/25/24  1050     Patient presents with: Dehydration   Curtis Morris is a 84 y.o. male.   HPI Patient with diarrhea.  Nausea.  Said for around a week.  Worried he is dehydrated.  States he gets a little lightheaded when he stands up.  A few weeks ago did have an increase in metformin but states the diarrhea did not start at that time.  No fevers.  No definite sick contacts.   Past Medical History:  Diagnosis Date   A-fib (HCC)    Diabetes mellitus without complication (HCC)    High cholesterol    Hypertension     Prior to Admission medications  Medication Sig Start Date End Date Taking? Authorizing Provider  ondansetron  (ZOFRAN -ODT) 4 MG disintegrating tablet Take 1 tablet (4 mg total) by mouth every 8 (eight) hours as needed for nausea or vomiting. 08/25/24  Yes Patsey Lot, MD  albuterol  (VENTOLIN  HFA) 108 [phone] Base) MCG/ACT inhaler Inhale 1-2 puffs into the lungs every 6 (six) hours as needed for wheezing or shortness of breath. 09/30/23   Daralene Lonni BIRCH, PA-C  apixaban  (ELIQUIS ) 5 MG TABS tablet Take 1 tablet (5 mg total) by mouth 2 (two) times daily. 12/02/21   Lue Elsie BROCKS, MD  aspirin EC 81 MG tablet Take 81 mg by mouth daily. Swallow whole.    [provider]  ATORVASTATIN CALCIUM PO Take 20 mg by mouth at bedtime.    [provider]  benzonatate  (TESSALON ) 100 MG capsule Take 1 capsule (100 mg total) by mouth every 8 (eight) hours. 09/30/23   Daralene Lonni BIRCH, PA-C  Calcium Carbonate-Vitamin D (CALCIUM-VITAMIN D3 PO) Take 1 tablet by mouth 2 (two) times daily.    [provider]  doxycycline  (VIBRAMYCIN ) 100 MG capsule Take 1 capsule (100 mg total) by mouth 2 (two) times daily. 09/30/23   Daralene Lonni BIRCH, PA-C  famotidine  (PEPCID ) 20 MG tablet Take 1 tablet (20 mg total) by mouth daily. 08/01/22   Dean Clarity, MD  furosemide  (LASIX ) 20 MG tablet Take 1 tablet (20 mg total) by mouth daily. 05/01/22   Bero, Michael M, MD  Lactulose  20 GM/30ML SOLN Take 20 mLs (13.3333 g total) by mouth in the morning and at bedtime. 05/05/24   Yolande Lamar BROCKS, MD  lipase/protease/amylase (CREON ) 12000 units CPEP capsule Take 12,000 Units by mouth 2 (two) times daily.    [provider]  magnesium oxide (MAG-OX) 400 MG tablet Take 400 mg by mouth daily.    [provider]  metFORMIN (GLUCOPHAGE) 500 MG tablet Take 500 mg by mouth every evening.    [provider]  metoprolol  tartrate (LOPRESSOR ) 50 MG tablet Take 1 tablet (50 mg total) by mouth 2 (two) times daily. 11/27/21   Lue Elsie BROCKS, MD  oxyCODONE -acetaminophen  (PERCOCET/ROXICET) 5-325 MG tablet Take 1 tablet by mouth every 6 (six) hours as needed for severe pain. 12/06/21   Ruthell Lonni FALCON, PA-C  pantoprazole  (PROTONIX ) 40 MG tablet Take 40 mg by mouth 2 (two) times daily.    [provider]  polyethylene glycol (MIRALAX ) 17 g packet Take 17 g by mouth 2 (two) times daily. 05/05/24   Yolande Lamar BROCKS, MD  tamsulosin  (FLOMAX ) 0.4 MG CAPS capsule Take 0.4 mg by mouth at bedtime.    [provider]    Allergies: Patient has no  known allergies.    Review of Systems  Updated Vital Signs BP 108/71   Pulse 78   Temp 97.6 F (36.4 C)   Resp 18   Ht 5' 11 (1.803 m)   Wt 53.1 kg   SpO2 94%   BMI 16.32 kg/m   Physical Exam Vitals and nursing note reviewed.  Cardiovascular:     Rate and Rhythm: Normal rate.  Pulmonary:     Breath sounds: No wheezing.  Abdominal:     Tenderness: There is no abdominal tenderness.  Skin:    Capillary Refill: Capillary refill takes less than 2 seconds.  Neurological:     Mental Status: He is alert and oriented to person, place, and time.     (all labs ordered are listed, but only abnormal results are displayed) Labs Reviewed  CBC WITH DIFFERENTIAL/PLATELET -  Abnormal; Notable for the following components:      Result Value   RBC 4.02 (*)    Hemoglobin 12.4 (*)    HCT 38.4 (*)    All other components within normal limits  COMPREHENSIVE METABOLIC PANEL WITH GFR - Abnormal; Notable for the following components:   CO2 21 (*)    Glucose, Bld 168 (*)    All other components within normal limits  LIPASE, BLOOD    EKG: None  Radiology: No results found.   Procedures   Medications Ordered in the ED  sodium chloride  0.9 % bolus 500 mL (0 mLs Intravenous Stopped 08/25/24 1220)  ondansetron  (ZOFRAN ) injection 4 mg (4 mg Intravenous Given 08/25/24 1323)                                    Medical Decision Making Amount and/or Complexity of Data Reviewed Labs: ordered.  Risk Prescription drug management.   Patient worried for dehydration.  Has had diarrhea and nausea.  Differential diagnosis does include dehydration electrolyte O'Malley's.  Will get basic blood work and give a fluid bolus.  Blood work reassuring.  Fluid bolus given and feeling somewhat better.  Able to ambulate.  Will discharge home.     Final diagnoses:  Nausea  Diarrhea, unspecified type    ED Discharge Orders          Ordered    ondansetron  (ZOFRAN -ODT) 4 MG disintegrating tablet  Every 8 hours PRN        08/25/24 1254               Patsey Lot, MD 08/25/24 1509  "

## 2024-08-25 NOTE — ED Triage Notes (Signed)
 Pt arrived via POV c/o diarrhea, and nausea for the past week and reports concern he is dehydrated.
